# Patient Record
Sex: Female | Born: 1950
Health system: Southern US, Community
[De-identification: ages and names within clinical notes are randomized; demographics above are authoritative.]

## PROBLEM LIST (undated history)

## (undated) DIAGNOSIS — I2510 Atherosclerotic heart disease of native coronary artery without angina pectoris: Secondary | ICD-10-CM

## (undated) DIAGNOSIS — Z87442 Personal history of urinary calculi: Secondary | ICD-10-CM

## (undated) DIAGNOSIS — N289 Disorder of kidney and ureter, unspecified: Secondary | ICD-10-CM

## (undated) DIAGNOSIS — N200 Calculus of kidney: Secondary | ICD-10-CM

## (undated) DIAGNOSIS — J449 Chronic obstructive pulmonary disease, unspecified: Secondary | ICD-10-CM

## (undated) DIAGNOSIS — I10 Essential (primary) hypertension: Secondary | ICD-10-CM

## (undated) HISTORY — PX: BREAST CYST ASPIRATION: SHX578

## (undated) HISTORY — PX: BREAST BIOPSY: SHX20

## (undated) HISTORY — DX: Calculus of kidney: N20.0

## (undated) HISTORY — PX: CARDIAC SURGERY: SHX584

---

## 2003-08-31 ENCOUNTER — Other Ambulatory Visit: Payer: Self-pay

## 2003-09-04 ENCOUNTER — Other Ambulatory Visit: Payer: Self-pay

## 2003-11-25 ENCOUNTER — Ambulatory Visit (HOSPITAL_COMMUNITY): Admission: RE | Admit: 2003-11-25 | Discharge: 2003-11-26 | Payer: Self-pay | Admitting: *Deleted

## 2005-04-11 ENCOUNTER — Emergency Department: Payer: Self-pay | Admitting: Unknown Physician Specialty

## 2005-04-11 ENCOUNTER — Other Ambulatory Visit: Payer: Self-pay

## 2007-10-14 DIAGNOSIS — I10 Essential (primary) hypertension: Secondary | ICD-10-CM | POA: Insufficient documentation

## 2007-10-14 DIAGNOSIS — E785 Hyperlipidemia, unspecified: Secondary | ICD-10-CM | POA: Insufficient documentation

## 2008-02-04 ENCOUNTER — Ambulatory Visit: Payer: Self-pay | Admitting: Internal Medicine

## 2008-02-25 ENCOUNTER — Ambulatory Visit: Payer: Self-pay | Admitting: Internal Medicine

## 2009-03-04 ENCOUNTER — Ambulatory Visit: Payer: Self-pay | Admitting: Internal Medicine

## 2012-11-19 ENCOUNTER — Ambulatory Visit: Payer: Self-pay | Admitting: Internal Medicine

## 2013-09-13 ENCOUNTER — Ambulatory Visit: Payer: Self-pay | Admitting: Internal Medicine

## 2014-09-04 ENCOUNTER — Ambulatory Visit
Admit: 2014-09-04 | Disposition: A | Discharge status: Home / Self Care | Payer: Self-pay | Attending: Internal Medicine | Admitting: Internal Medicine

## 2015-06-23 ENCOUNTER — Other Ambulatory Visit: Payer: Self-pay | Admitting: Internal Medicine

## 2015-06-23 DIAGNOSIS — Z1231 Encounter for screening mammogram for malignant neoplasm of breast: Secondary | ICD-10-CM

## 2015-09-06 ENCOUNTER — Ambulatory Visit
Admission: RE | Admit: 2015-09-06 | Discharge: 2015-09-06 | Disposition: A | Discharge status: Home / Self Care | Payer: BLUE CROSS/BLUE SHIELD | Source: Ambulatory Visit | Attending: Internal Medicine | Admitting: Internal Medicine

## 2015-09-06 DIAGNOSIS — Z1231 Encounter for screening mammogram for malignant neoplasm of breast: Secondary | ICD-10-CM | POA: Diagnosis present

## 2015-09-08 ENCOUNTER — Other Ambulatory Visit: Payer: Self-pay | Admitting: Internal Medicine

## 2015-09-08 DIAGNOSIS — R928 Other abnormal and inconclusive findings on diagnostic imaging of breast: Secondary | ICD-10-CM

## 2015-09-15 ENCOUNTER — Other Ambulatory Visit: Payer: BLUE CROSS/BLUE SHIELD

## 2015-09-15 ENCOUNTER — Ambulatory Visit: Payer: BLUE CROSS/BLUE SHIELD

## 2015-10-01 ENCOUNTER — Ambulatory Visit
Admission: RE | Admit: 2015-10-01 | Discharge: 2015-10-01 | Disposition: A | Discharge status: Home / Self Care | Payer: BLUE CROSS/BLUE SHIELD | Source: Ambulatory Visit | Attending: Internal Medicine | Admitting: Internal Medicine

## 2015-10-01 DIAGNOSIS — N6489 Other specified disorders of breast: Secondary | ICD-10-CM | POA: Diagnosis present

## 2015-10-01 DIAGNOSIS — R928 Other abnormal and inconclusive findings on diagnostic imaging of breast: Secondary | ICD-10-CM

## 2015-10-01 DIAGNOSIS — N63 Unspecified lump in breast: Secondary | ICD-10-CM | POA: Diagnosis not present

## 2016-01-02 ENCOUNTER — Inpatient Hospital Stay
Admission: EM | Admit: 2016-01-02 | Discharge: 2016-01-04 | DRG: 247 | Disposition: A | Discharge status: Home / Self Care | Payer: BC Managed Care – PPO | Attending: Internal Medicine | Admitting: Internal Medicine

## 2016-01-02 ENCOUNTER — Emergency Department: Payer: BC Managed Care – PPO

## 2016-01-02 ENCOUNTER — Encounter: Payer: Self-pay | Admitting: Emergency Medicine

## 2016-01-02 DIAGNOSIS — I119 Hypertensive heart disease without heart failure: Secondary | ICD-10-CM | POA: Diagnosis present

## 2016-01-02 DIAGNOSIS — I2510 Atherosclerotic heart disease of native coronary artery without angina pectoris: Secondary | ICD-10-CM | POA: Diagnosis present

## 2016-01-02 DIAGNOSIS — Z955 Presence of coronary angioplasty implant and graft: Secondary | ICD-10-CM

## 2016-01-02 DIAGNOSIS — E871 Hypo-osmolality and hyponatremia: Secondary | ICD-10-CM | POA: Diagnosis present

## 2016-01-02 DIAGNOSIS — F17210 Nicotine dependence, cigarettes, uncomplicated: Secondary | ICD-10-CM | POA: Diagnosis present

## 2016-01-02 DIAGNOSIS — Z91041 Radiographic dye allergy status: Secondary | ICD-10-CM

## 2016-01-02 DIAGNOSIS — R778 Other specified abnormalities of plasma proteins: Secondary | ICD-10-CM

## 2016-01-02 DIAGNOSIS — Z880 Allergy status to penicillin: Secondary | ICD-10-CM | POA: Diagnosis not present

## 2016-01-02 DIAGNOSIS — J449 Chronic obstructive pulmonary disease, unspecified: Secondary | ICD-10-CM | POA: Diagnosis present

## 2016-01-02 DIAGNOSIS — I214 Non-ST elevation (NSTEMI) myocardial infarction: Secondary | ICD-10-CM | POA: Diagnosis not present

## 2016-01-02 DIAGNOSIS — R7989 Other specified abnormal findings of blood chemistry: Secondary | ICD-10-CM

## 2016-01-02 DIAGNOSIS — R079 Chest pain, unspecified: Secondary | ICD-10-CM | POA: Diagnosis present

## 2016-01-02 HISTORY — DX: Essential (primary) hypertension: I10

## 2016-01-02 HISTORY — DX: Disorder of kidney and ureter, unspecified: N28.9

## 2016-01-02 HISTORY — DX: Atherosclerotic heart disease of native coronary artery without angina pectoris: I25.10

## 2016-01-02 LAB — CBC
HCT: 41.6 % (ref 35.0–47.0)
HEMOGLOBIN: 14.7 g/dL (ref 12.0–16.0)
MCH: 34.5 pg — AB (ref 26.0–34.0)
MCHC: 35.3 g/dL (ref 32.0–36.0)
MCV: 97.7 fL (ref 80.0–100.0)
Platelets: 271 10*3/uL (ref 150–440)
RBC: 4.25 MIL/uL (ref 3.80–5.20)
RDW: 13.9 % (ref 11.5–14.5)
WBC: 6.5 10*3/uL (ref 3.6–11.0)

## 2016-01-02 LAB — TROPONIN I
TROPONIN I: 0.43 ng/mL — AB (ref <0.03)
TROPONIN I: 1.68 ng/mL — AB (ref <0.03)
Troponin I: 3.12 ng/mL (ref <0.03)

## 2016-01-02 LAB — BASIC METABOLIC PANEL
ANION GAP: 11 (ref 5–15)
BUN: 6 mg/dL (ref 6–20)
CALCIUM: 9.9 mg/dL (ref 8.9–10.3)
CO2: 22 mmol/L (ref 22–32)
CREATININE: 0.72 mg/dL (ref 0.44–1.00)
Chloride: 98 mmol/L — ABNORMAL LOW (ref 101–111)
GFR calc Af Amer: >60 mL/min (ref >60)
GFR calc non Af Amer: >60 mL/min (ref >60)
GLUCOSE: 137 mg/dL — AB (ref 65–99)
Potassium: 3.6 mmol/L (ref 3.5–5.1)
Sodium: 131 mmol/L — ABNORMAL LOW (ref 135–145)

## 2016-01-02 LAB — PROTIME-INR
INR: 1.11
PROTHROMBIN TIME: 14.3 s (ref 11.4–15.2)

## 2016-01-02 LAB — APTT: aPTT: >160 seconds — ABNORMAL HIGH (ref 24–36)

## 2016-01-02 MED ORDER — ASPIRIN EC 81 MG PO TBEC
81.0000 mg | DELAYED_RELEASE_TABLET | Freq: Every day | ORAL | Status: DC
  Filled 2016-01-02: qty 1

## 2016-01-02 MED ORDER — ACETAMINOPHEN 325 MG PO TABS: 650.0000 mg | ORAL_TABLET | Freq: Four times a day (QID) | ORAL | Status: DC | PRN

## 2016-01-02 MED ORDER — HEPARIN BOLUS VIA INFUSION
2800.0000 [IU] | Freq: Once | INTRAVENOUS | Status: AC
  Administered 2016-01-02: 2800 [IU] via INTRAVENOUS
  Filled 2016-01-02: qty 2800

## 2016-01-02 MED ORDER — ACETAMINOPHEN 650 MG RE SUPP
650.0000 mg | Freq: Four times a day (QID) | RECTAL | Status: DC | PRN
Start: 2016-01-02 — End: 2016-01-04

## 2016-01-02 MED ORDER — ONDANSETRON HCL 4 MG/2ML IJ SOLN: 4.0000 mg | Freq: Four times a day (QID) | INTRAMUSCULAR | Status: DC | PRN

## 2016-01-02 MED ORDER — BISACODYL 5 MG PO TBEC
5.0000 mg | DELAYED_RELEASE_TABLET | Freq: Every day | ORAL | Status: DC | PRN
  Filled 2016-01-02: qty 1

## 2016-01-02 MED ORDER — SODIUM CHLORIDE 0.9 % IV SOLN
INTRAVENOUS | Status: DC
  Administered 2016-01-02 – 2016-01-04 (×3): via INTRAVENOUS

## 2016-01-02 MED ORDER — DOCUSATE SODIUM 100 MG PO CAPS
100.0000 mg | ORAL_CAPSULE | Freq: Two times a day (BID) | ORAL | Status: DC
  Administered 2016-01-02 – 2016-01-04 (×3): 100 mg via ORAL
  Filled 2016-01-02 (×4): qty 1

## 2016-01-02 MED ORDER — ASPIRIN EC 325 MG PO TBEC
325.0000 mg | DELAYED_RELEASE_TABLET | Freq: Every day | ORAL | Status: DC
  Administered 2016-01-03: 325 mg via ORAL
  Filled 2016-01-02: qty 1

## 2016-01-02 MED ORDER — HEPARIN (PORCINE) IN NACL 100-0.45 UNIT/ML-% IJ SOLN
400.0000 [IU]/h | INTRAMUSCULAR | Status: DC
  Administered 2016-01-02: 550 [IU]/h via INTRAVENOUS
  Filled 2016-01-02 (×2): qty 250

## 2016-01-02 MED ORDER — ROSUVASTATIN CALCIUM 10 MG PO TABS
10.0000 mg | ORAL_TABLET | Freq: Every day | ORAL | Status: DC
  Administered 2016-01-03 – 2016-01-04 (×2): 10 mg via ORAL
  Filled 2016-01-02 (×2): qty 1

## 2016-01-02 MED ORDER — NITROGLYCERIN 2 % TD OINT
0.5000 [in_us] | TOPICAL_OINTMENT | Freq: Once | TRANSDERMAL | Status: AC
  Administered 2016-01-02: 0.5 [in_us] via TOPICAL
  Filled 2016-01-02: qty 1

## 2016-01-02 MED ORDER — ONDANSETRON HCL 4 MG PO TABS: 4.0000 mg | ORAL_TABLET | Freq: Four times a day (QID) | ORAL | Status: DC | PRN

## 2016-01-02 MED ORDER — ASPIRIN 81 MG PO CHEW
243.0000 mg | CHEWABLE_TABLET | Freq: Once | ORAL | Status: AC
  Administered 2016-01-02: 243 mg via ORAL
  Filled 2016-01-02: qty 3

## 2016-01-02 MED ORDER — SODIUM CHLORIDE 0.9% FLUSH
3.0000 mL | Freq: Two times a day (BID) | INTRAVENOUS | Status: DC
  Administered 2016-01-02 – 2016-01-04 (×3): 3 mL via INTRAVENOUS

## 2016-01-02 MED ORDER — HYDROCODONE-ACETAMINOPHEN 5-325 MG PO TABS: 1.0000 | ORAL_TABLET | ORAL | Status: DC | PRN

## 2016-01-02 MED ORDER — NITROGLYCERIN 2 % TD OINT
0.5000 [in_us] | TOPICAL_OINTMENT | Freq: Four times a day (QID) | TRANSDERMAL | Status: DC
  Administered 2016-01-02: 1 [in_us] via TOPICAL
  Administered 2016-01-03 (×2): 0.5 [in_us] via TOPICAL
  Administered 2016-01-03: 1 [in_us] via TOPICAL
  Filled 2016-01-02 (×5): qty 1

## 2016-01-02 NOTE — ED Notes (Signed)
Spoke with hospitalist regarding elevated troponin, hospitalist to order heparin. Confirmed with pt she will accept heparin.

## 2016-01-02 NOTE — Progress Notes (Addendum)
ANTICOAGULATION CONSULT NOTE - Initial Consult  Pharmacy Consult for Heparin Drip  Indication: chest pain/ACS  Allergies  Allergen Reactions  . Penicillins Anaphylaxis    Has patient had a PCN reaction causing immediate rash, facial/tongue/throat swelling, SOB or lightheadedness with hypotension: Yes Has patient had a PCN reaction causing severe rash involving mucus membranes or skin necrosis: No Has patient had a PCN reaction that required hospitalization No Has patient had a PCN reaction occurring within the last 10 years: Yes If all of the above answers are "NO", then may proceed with Cephalosporin use.  Julia Dominguez [Iodinated Diagnostic Agents] Swelling    Patient Measurements: Height: '5\' 1"'$  (154.9 cm) Weight: 103 lb (46.7 kg) IBW/kg (Calculated) : 47.8  Vital Signs: Temp: 97.9 F (36.6 C) (08/06 1541) Temp Source: Oral (08/06 1541) BP: 169/83 (08/06 1900) Pulse Rate: 59 (08/06 1900)   Recent Labs  01/02/16 1547 01/02/16 1834  HGB 14.7  --   HCT 41.6  --   PLT 271  --   CREATININE 0.72  --   TROPONINI 0.43* 1.68*    Estimated Creatinine Clearance: 53.1 mL/min (by C-G formula based on SCr of 0.8 mg/dL).   Medical History: Past Medical History:  Diagnosis Date  . Coronary artery disease   . Hypertension   . Renal disorder    renal stent    Assessment: 65 yo female with chest pain and elevated troponin of 1.68. Pharmacy consulted for heparin dosing for NSTEMI.   Goal of Therapy:  Heparin level 0.3-0.7 units/ml Monitor platelets by anticoagulation protocol: Yes   Plan:  Baseline PT/INR and aPTT ordered.  Patient is 47kg Give 2800 units bolus x 1 Start heparin infusion at 550 units/hr Check anti-Xa level in 6 hours and daily while on heparin Continue to monitor H&H and platelets  Julia Dominguez, PharmD Clinical Pharmacist 01/02/2016 8:19 PM  [id number] baseline aPTT supratherapeutic, but was drawn 2 hours after bolus/infusion began. Will hold x 1  hour then resume at 400 units/hr. Recheck HL at 0700. Julia Dominguez A. [place], [place].D., BCPS

## 2016-01-02 NOTE — ED Triage Notes (Signed)
Patient c/o chest pain.  States pain initially started this morning with a mid back pain that radiated down left arm and then worsened to include mid chest pain.  Patient describes pain as heaviness and pressure "like a cinder block sitting on my chest".  States pain is similar to pain she had prior to having stents placed in 2007.  Pain is currently in back and a "gripping" pain around heart.

## 2016-01-02 NOTE — ED Notes (Signed)
Pt with nitro paste already applied by previous nurse.

## 2016-01-02 NOTE — H&P (Signed)
[facility] [place] NAME: Julia Dominguez    MR#:  [id number]  DATE OF BIRTH:  06-14-1950  DATE OF ADMISSION:  01/02/2016  PRIMARY CARE PHYSICIAN: No primary care provider on file.  Says that she sees Dr.Neelam Humphrey Rolls  REQUESTING/REFERRING PHYSICIAN: Dr. Conni Slipper  CHIEF COMPLAINT:   Chief Complaint  Patient presents with  . Chest Pain    HISTORY OF PRESENT ILLNESS:  Julia Dominguez  is a 65 y.o. female with a known history of Hypertension, coronary artery disease with history of stent placement. Your several comes in with chest pain. Since this morning Started having chest pain in the middle of the chest radiation to the back. Patient also complained of chest pain going to the left arm. Patient felt like chest pain was heaviness in the chest associated with nausea, sweating. He reports that she woke up. This morning. With chest pain. And she feels similar to what she had prior to getting her stent in 2007. Chest pain relieved with nitroglycerin given in the emergency room. PAST MEDICAL HISTORY:   Past Medical History:  Diagnosis Date  . Coronary artery disease   . Hypertension   . Renal disorder    renal stent    PAST SURGICAL HISTOIRY:   Past Surgical History:  Procedure Laterality Date  . BREAST BIOPSY Right    neg  . CARDIAC SURGERY     cardiac stents placed 2007    SOCIAL HISTORY:   Social History  Substance Use Topics  . Smoking status: Current Every Day Smoker    Types: Cigarettes  . Smokeless tobacco: Never Used  . Alcohol use Not on file    FAMILY HISTORY:   Family History  Problem Relation Age of Onset  . Breast cancer Paternal Grandmother     DRUG ALLERGIES:   Allergies  Allergen Reactions  . Ivp Dye [Iodinated Diagnostic Agents] Swelling  . Penicillins     REVIEW OF SYSTEMS:  CONSTITUTIONAL: No fever, fatigue or weakness.  EYES: No blurred or double vision.  EARS, NOSE, AND THROAT:  No tinnitus or ear pain.  RESPIRATORY: No cough, shortness of breath, wheezing or hemoptysis.  CARDIOVASCULAR: No chest pain, orthopnea, edema.  GASTROINTESTINAL: No nausea, vomiting, diarrhea or abdominal pain.  GENITOURINARY: No dysuria, hematuria.  ENDOCRINE: No polyuria, nocturia,  HEMATOLOGY: No anemia, easy bruising or bleeding SKIN: No rash or lesion. MUSCULOSKELETAL: No joint pain or arthritis.   NEUROLOGIC: No tingling, numbness, weakness.  PSYCHIATRY: No anxiety or depression.   MEDICATIONS AT HOME:   Prior to Admission medications   Not on File      VITAL SIGNS:  Blood pressure 137/82, pulse (!) 56, temperature 97.9 F (36.6 C), temperature source Oral, resp. rate 17, height '5\' 1"'$  (1.549 m), weight 46.7 kg (103 lb), SpO2 99 %.  PHYSICAL EXAMINATION:  GENERAL:  65 y.o.-year-old patient lying in the bed with no acute distress.  EYES: Pupils equal, round, reactive to light and accommodation. No scleral icterus. Extraocular muscles intact.  HEENT: Head atraumatic, normocephalic. Oropharynx and nasopharynx clear.  NECK:  Supple, no jugular venous distention. No thyroid enlargement, no tenderness.  LUNGS: Normal breath sounds bilaterally, no wheezing, rales,rhonchi or crepitation. No use of accessory muscles of respiration.  CARDIOVASCULAR: S1, S2 normal. No murmurs, rubs, or gallops.  ABDOMEN: Soft, nontender, nondistended. Bowel sounds present. No organomegaly or mass.  EXTREMITIES: No pedal edema, cyanosis, or clubbing.  NEUROLOGIC: Cranial nerves II through XII are  intact. Muscle strength 5/5 in all extremities. Sensation intact. Gait not checked.  PSYCHIATRIC: The patient is alert and oriented x 3.  SKIN: No obvious rash, lesion, or ulcer.   LABORATORY PANEL:   CBC  Recent Labs Lab 01/02/16 1547  WBC 6.5  HGB 14.7  HCT 41.6  PLT 271    ------------------------------------------------------------------------------------------------------------------  Chemistries   Recent Labs Lab 01/02/16 1547  NA 131*  K 3.6  CL 98*  CO2 22  GLUCOSE 137*  BUN 6  CREATININE 0.72  CALCIUM 9.9   ------------------------------------------------------------------------------------------------------------------  Cardiac Enzymes  Recent Labs Lab 01/02/16 1547  TROPONINI 0.43*   ------------------------------------------------------------------------------------------------------------------  RADIOLOGY:  Dg Chest 2 View  Result Date: 01/02/2016 CLINICAL DATA:  65 year old female with mid chest pain since this morning. EXAM: CHEST  2 VIEW COMPARISON:  Chest x-ray 04/11/2005. FINDINGS: Lung volumes are normal. No consolidative airspace disease. No pleural effusions. No pneumothorax. No pulmonary nodule or mass noted. Pulmonary vasculature and the cardiomediastinal silhouette are within normal limits. Atherosclerosis in the thoracic aorta. IMPRESSION: 1.  No radiographic evidence of acute cardiopulmonary disease. 2. Aortic atherosclerosis. Electronically Signed   By: Vinnie Langton M.D.   On: 01/02/2016 16:48    EKG:   Orders placed or performed during the hospital encounter of 01/02/16  . ED EKG within 10 minutes  . ED EKG within 10 minutes  . EKG 12-Lead  . EKG 12-Lead   Normal sinus rhythm 62 bpm ST depressions in lead V5, V6., ST depressions in V aVL, IMPRESSION AND PLAN:   #1 chest pain with non-ST elevation MI in a patient with history of coronary disease, stent placement, elevated troponins up to 0.43. Concerning for unstable angina. Started on aspirin, nitrates. Patient has bradycardia with heart rate in 50s so unable to use beta blockers. Patient refused  Full  dose anticoagulation,says that her blood is  Too  thin and doesn't want any heparin or Lovenox. Consults cardiology, patient has seen Dr. Clayborn Bigness  before. Started on high intensity statins also. #2 history of COPD with continued tobacco abuse patient says that she smokes one pack of cigarettes every 3 days. Advised to quit smoking, offered assistance, smoking cessation counseling done for 5 minutes.  D/w son and pt All the records are reviewed and case discussed with ED provider. Management plans discussed with the patient, family and they are in agreement.  CODE STATUS:  Full code  TOTAL TIME TAKING CARE OF THIS PATIENT: 55  minutes.    Epifanio Lesches M.D on 01/02/2016 at 5:28 PM  Between 7am to 6pm - Pager - [phone]  After 6pm go to [email or web address] - password [email or web address] [facility]  [facility]  Office  [phone]  CC: Primary care physician; No primary care provider on file.  Note: This dictation was prepared with Dragon dictation along with smaller phrase technology. Any transcriptional errors that result from this process are unintentional.

## 2016-01-02 NOTE — ED Provider Notes (Addendum)
[facility] Provider Note   CSN: [id number] Arrival date & time: 01/02/16  1530  First Provider Contact:  First MD Initiated Contact with Patient 01/02/16 1627        History   Chief Complaint Chief Complaint  Patient presents with  . Chest Pain    HPI Julia Dominguez is a 65 y.o. female who has a past history of high blood pressure high cholesterol has one stent placed in 2007. Patient reports she woke up this morning with pain in her back and pain going down her left arm. This persisted intermittently all to the course of day and then this evening she began having feeling like cinderblocks were sitting on her chest and another was chest heaviness. This feels similar to what she had prior to getting her stent in 2007. Patient really has not been that active to see if activity make it worse. No other associated symptoms, and it  HPI  Past Medical History:  Diagnosis Date  . Coronary artery disease   . Hypertension   . Renal disorder    renal stent    There are no active problems to display for this patient.   Past Surgical History:  Procedure Laterality Date  . BREAST BIOPSY Right    neg  . CARDIAC SURGERY     cardiac stents placed 2007    OB History    No data available       Home Medications    Prior to Admission medications   Not on File    Family History Family History  Problem Relation Age of Onset  . Breast cancer Paternal Grandmother     Social History Social History  Substance Use Topics  . Smoking status: Current Every Day Smoker    Types: Cigarettes  . Smokeless tobacco: Never Used  . Alcohol use Not on file     Allergies   Ivp dye [iodinated diagnostic agents] and Penicillins   Review of Systems Review of Systems   Physical Exam Updated Vital Signs BP (!) 162/77 (BP Location: Right Arm)   Pulse 61   Temp 97.9 F (36.6 C) (Oral)   Resp 18   Ht '5\' 1"'$  (1.549 m)   Wt 103 lb (46.7 kg)   SpO2 97%   BMI  19.46 kg/m   Physical Exam   ED Treatments / Results  Labs (all labs ordered are listed, but only abnormal results are displayed) Labs Reviewed  BASIC METABOLIC PANEL - Abnormal; Notable for the following:       Result Value   Sodium 131 (*)    Chloride 98 (*)    Glucose, Bld 137 (*)    All other components within normal limits  CBC - Abnormal; Notable for the following:    MCH 34.5 (*)    All other components within normal limits  TROPONIN I - Abnormal; Notable for the following:    Troponin I 0.43 (*)    All other components within normal limits    EKG  EKG Interpretation None     EKG read and interpreted by me shows sinus rhythm actually sinus bradycardia rate of 57 normal axis there is some ST segment downsloping in the lateral chest leads  Radiology Dg Chest 2 View  Result Date: 01/02/2016 CLINICAL DATA:  65 year old female with mid chest pain since this morning. EXAM: CHEST  2 VIEW COMPARISON:  Chest x-ray 04/11/2005. FINDINGS: Lung volumes are normal. No consolidative airspace disease. No pleural effusions. No pneumothorax.  No pulmonary nodule or mass noted. Pulmonary vasculature and the cardiomediastinal silhouette are within normal limits. Atherosclerosis in the thoracic aorta. IMPRESSION: 1.  No radiographic evidence of acute cardiopulmonary disease. 2. Aortic atherosclerosis. Electronically Signed   By: Vinnie Langton M.D.   On: 01/02/2016 16:48     Chest x-ray has been done and completed but is not available in the computer at all Procedures Procedures (including critical care time)  Medications Ordered in ED Medications  aspirin chewable tablet 243 mg (not administered)  nitroGLYCERIN (NITROGLYN) 2 % ointment 0.5 inch (not administered)     Initial Impression / Assessment and Plan / ED Course  I have reviewed the triage vital signs and the nursing notes.  Pertinent labs & imaging results that were available during my care of the patient were reviewed  by me and considered in my medical decision making (see chart for details).  Clinical Course      Final Clinical Impressions(s) / ED Diagnoses   Final diagnoses:  Elevated troponin  NSTEMI (non-ST elevated myocardial infarction) [facility])    New Prescriptions New Prescriptions   No medications on file     Nena Polio, MD 01/02/16 1657    Nena Polio, MD 01/13/16 [phone]

## 2016-01-02 NOTE — ED Notes (Signed)
Admitting MD at bedside at this time.

## 2016-01-02 NOTE — ED Notes (Signed)
Attempt to call report x3 since 0704 without success.

## 2016-01-03 ENCOUNTER — Encounter: Payer: Self-pay | Admitting: Internal Medicine

## 2016-01-03 ENCOUNTER — Encounter
Admission: EM | Disposition: A | Discharge status: Home / Self Care | Payer: Self-pay | Source: Home / Self Care | Attending: Internal Medicine

## 2016-01-03 DIAGNOSIS — I214 Non-ST elevation (NSTEMI) myocardial infarction: Secondary | ICD-10-CM

## 2016-01-03 HISTORY — PX: CARDIAC CATHETERIZATION: SHX172

## 2016-01-03 LAB — BASIC METABOLIC PANEL
ANION GAP: 6 (ref 5–15)
BUN: 6 mg/dL (ref 6–20)
CALCIUM: 8.8 mg/dL — AB (ref 8.9–10.3)
CHLORIDE: 105 mmol/L (ref 101–111)
CO2: 23 mmol/L (ref 22–32)
Creatinine, Ser: 0.49 mg/dL (ref 0.44–1.00)
GFR calc non Af Amer: >60 mL/min (ref >60)
Glucose, Bld: 107 mg/dL — ABNORMAL HIGH (ref 65–99)
POTASSIUM: 3.5 mmol/L (ref 3.5–5.1)
Sodium: 134 mmol/L — ABNORMAL LOW (ref 135–145)

## 2016-01-03 LAB — TROPONIN I: TROPONIN I: 5.05 ng/mL — AB (ref <0.03)

## 2016-01-03 LAB — CBC
HEMATOCRIT: 37 % (ref 35.0–47.0)
HEMOGLOBIN: 13 g/dL (ref 12.0–16.0)
MCH: 34.2 pg — AB (ref 26.0–34.0)
MCHC: 35.1 g/dL (ref 32.0–36.0)
MCV: 97.2 fL (ref 80.0–100.0)
Platelets: 243 10*3/uL (ref 150–440)
RBC: 3.8 MIL/uL (ref 3.80–5.20)
RDW: 14 % (ref 11.5–14.5)
WBC: 7.5 10*3/uL (ref 3.6–11.0)

## 2016-01-03 LAB — HEPARIN LEVEL (UNFRACTIONATED)
Heparin Unfractionated: 0.13 IU/mL — ABNORMAL LOW (ref 0.30–0.70)
Heparin Unfractionated: <0.1 IU/mL — ABNORMAL LOW (ref 0.30–0.70)

## 2016-01-03 LAB — GLUCOSE, CAPILLARY: Glucose-Capillary: 106 mg/dL — ABNORMAL HIGH (ref 65–99)

## 2016-01-03 SURGERY — LEFT HEART CATH
Anesthesia: Moderate Sedation | Laterality: Right

## 2016-01-03 MED ORDER — ASPIRIN 81 MG PO CHEW: 81.0000 mg | CHEWABLE_TABLET | ORAL | Status: DC

## 2016-01-03 MED ORDER — SODIUM CHLORIDE 0.9 % WEIGHT BASED INFUSION: 1.0000 mL/kg/h | INTRAVENOUS | Status: DC

## 2016-01-03 MED ORDER — TICAGRELOR 90 MG PO TABS
ORAL_TABLET | ORAL | Status: DC | PRN
  Administered 2016-01-03: 180 mg via ORAL

## 2016-01-03 MED ORDER — MIDAZOLAM HCL 2 MG/2ML IJ SOLN
INTRAMUSCULAR | Status: DC | PRN
  Administered 2016-01-03: 1 mg via INTRAVENOUS

## 2016-01-03 MED ORDER — TICAGRELOR 90 MG PO TABS
ORAL_TABLET | ORAL | Status: AC
  Filled 2016-01-03: qty 2

## 2016-01-03 MED ORDER — ASPIRIN 81 MG PO CHEW
81.0000 mg | CHEWABLE_TABLET | Freq: Every day | ORAL | Status: DC
  Administered 2016-01-04: 81 mg via ORAL
  Filled 2016-01-03: qty 1

## 2016-01-03 MED ORDER — NITROGLYCERIN 1 MG/10 ML FOR IR/CATH LAB
INTRA_ARTERIAL | Status: DC | PRN
  Administered 2016-01-03: 200 ug via INTRA_ARTERIAL

## 2016-01-03 MED ORDER — METHYLPREDNISOLONE SODIUM SUCC 125 MG IJ SOLR: 125.0000 mg | INTRAMUSCULAR | Status: DC

## 2016-01-03 MED ORDER — BIVALIRUDIN 250 MG IV SOLR
INTRAVENOUS | Status: AC
  Filled 2016-01-03: qty 250

## 2016-01-03 MED ORDER — FENTANYL CITRATE (PF) 100 MCG/2ML IJ SOLN
INTRAMUSCULAR | Status: DC | PRN
  Administered 2016-01-03 (×2): 25 ug via INTRAVENOUS

## 2016-01-03 MED ORDER — SODIUM CHLORIDE 0.9 % WEIGHT BASED INFUSION: 3.0000 mL/kg/h | INTRAVENOUS | Status: DC

## 2016-01-03 MED ORDER — FAMOTIDINE IN NACL 20-0.9 MG/50ML-% IV SOLN
20.0000 mg | INTRAVENOUS | Status: DC
  Filled 2016-01-03: qty 50

## 2016-01-03 MED ORDER — SODIUM CHLORIDE 0.9 % IV SOLN
0.2500 mg/kg/h | INTRAVENOUS | Status: DC
  Filled 2016-01-03: qty 250

## 2016-01-03 MED ORDER — SODIUM CHLORIDE 0.9 % WEIGHT BASED INFUSION
3.0000 mL/kg/h | INTRAVENOUS | Status: AC
  Administered 2016-01-03 (×2): 3 mL/kg/h via INTRAVENOUS

## 2016-01-03 MED ORDER — DIPHENHYDRAMINE HCL 50 MG/ML IJ SOLN
INTRAMUSCULAR | Status: AC
  Administered 2016-01-03: 14:00:00
  Filled 2016-01-03: qty 1

## 2016-01-03 MED ORDER — FAMOTIDINE 20 MG PO TABS
ORAL_TABLET | ORAL | Status: AC
  Administered 2016-01-03: 14:00:00
  Filled 2016-01-03: qty 1

## 2016-01-03 MED ORDER — SODIUM CHLORIDE 0.9% FLUSH: 3.0000 mL | INTRAVENOUS | Status: DC | PRN

## 2016-01-03 MED ORDER — METHYLPREDNISOLONE SODIUM SUCC 125 MG IJ SOLR
INTRAMUSCULAR | Status: AC
  Administered 2016-01-03: 14:00:00
  Filled 2016-01-03: qty 2

## 2016-01-03 MED ORDER — NICOTINE 21 MG/24HR TD PT24: 21.0000 mg | MEDICATED_PATCH | Freq: Every day | TRANSDERMAL | Status: DC

## 2016-01-03 MED ORDER — DIPHENHYDRAMINE HCL 50 MG/ML IJ SOLN: 25.0000 mg | INTRAMUSCULAR | Status: DC

## 2016-01-03 MED ORDER — NITROGLYCERIN 5 MG/ML IV SOLN
INTRAVENOUS | Status: AC
  Filled 2016-01-03: qty 10

## 2016-01-03 MED ORDER — SODIUM CHLORIDE 0.9 % IV SOLN: 250.0000 mL | INTRAVENOUS | Status: DC | PRN

## 2016-01-03 MED ORDER — TICAGRELOR 90 MG PO TABS
90.0000 mg | ORAL_TABLET | Freq: Two times a day (BID) | ORAL | Status: DC
  Administered 2016-01-03 – 2016-01-04 (×2): 90 mg via ORAL
  Filled 2016-01-03 (×2): qty 1

## 2016-01-03 MED ORDER — BIVALIRUDIN BOLUS VIA INFUSION - CUPID
INTRAVENOUS | Status: DC | PRN
  Administered 2016-01-03: 34.575 mg via INTRAVENOUS

## 2016-01-03 MED ORDER — FAMOTIDINE IN NACL 20-0.9 MG/50ML-% IV SOLN
INTRAVENOUS | Status: DC | PRN
  Administered 2016-01-03: 20 mg via INTRAVENOUS

## 2016-01-03 MED ORDER — HEPARIN (PORCINE) IN NACL 100-0.45 UNIT/ML-% IJ SOLN
550.0000 [IU]/h | INTRAMUSCULAR | Status: DC
  Administered 2016-01-03: 550 [IU]/h via INTRAVENOUS
  Filled 2016-01-03: qty 250

## 2016-01-03 MED ORDER — SODIUM CHLORIDE 0.9% FLUSH: 3.0000 mL | Freq: Two times a day (BID) | INTRAVENOUS | Status: DC

## 2016-01-03 MED ORDER — FENTANYL CITRATE (PF) 100 MCG/2ML IJ SOLN
INTRAMUSCULAR | Status: DC | PRN
  Administered 2016-01-03: 25 ug via INTRAVENOUS

## 2016-01-03 MED ORDER — MIDAZOLAM HCL 2 MG/2ML IJ SOLN
INTRAMUSCULAR | Status: AC
  Filled 2016-01-03: qty 2

## 2016-01-03 MED ORDER — FENTANYL CITRATE (PF) 100 MCG/2ML IJ SOLN
INTRAMUSCULAR | Status: AC
  Filled 2016-01-03: qty 2

## 2016-01-03 MED ORDER — SODIUM CHLORIDE 0.9 % IV SOLN
INTRAVENOUS | Status: DC | PRN
  Administered 2016-01-03: 1.75 mg/kg/h via INTRAVENOUS

## 2016-01-03 MED ORDER — HEPARIN (PORCINE) IN NACL 2-0.9 UNIT/ML-% IJ SOLN
INTRAMUSCULAR | Status: AC
  Filled 2016-01-03: qty 500

## 2016-01-03 MED ORDER — IOPAMIDOL (ISOVUE-300) INJECTION 61%
INTRAVENOUS | Status: DC | PRN
  Administered 2016-01-03: 250 mL via INTRA_ARTERIAL

## 2016-01-03 MED ORDER — FAMOTIDINE 20 MG PO TABS
20.0000 mg | ORAL_TABLET | Freq: Once | ORAL | Status: AC
  Administered 2016-01-03: 20 mg via ORAL

## 2016-01-03 MED ORDER — ACETAMINOPHEN 325 MG PO TABS: 650.0000 mg | ORAL_TABLET | ORAL | Status: DC | PRN

## 2016-01-03 MED ORDER — METHYLPREDNISOLONE SODIUM SUCC 125 MG IJ SOLR
INTRAMUSCULAR | Status: DC | PRN
  Administered 2016-01-03: 125 mg via INTRAVENOUS

## 2016-01-03 MED ORDER — DIPHENHYDRAMINE HCL 50 MG/ML IJ SOLN
INTRAMUSCULAR | Status: DC | PRN
  Administered 2016-01-03: 50 mg via INTRAVENOUS

## 2016-01-03 MED ORDER — HEPARIN BOLUS VIA INFUSION
1400.0000 [IU] | Freq: Once | INTRAVENOUS | Status: AC
  Administered 2016-01-03: 1400 [IU] via INTRAVENOUS
  Filled 2016-01-03: qty 1400

## 2016-01-03 MED ORDER — ONDANSETRON HCL 4 MG/2ML IJ SOLN: 4.0000 mg | Freq: Four times a day (QID) | INTRAMUSCULAR | Status: DC | PRN

## 2016-01-03 SURGICAL SUPPLY — 16 items
BALLN TREK RX 2.5X12 (BALLOONS) ×3
BALLOON TREK RX 2.5X12 (BALLOONS) IMPLANT
CATH INFINITI 5FR ANG PIGTAIL (CATHETERS) ×1 IMPLANT
CATH INFINITI 5FR JL4 (CATHETERS) ×1 IMPLANT
CATH INFINITI JR4 5F (CATHETERS) ×1 IMPLANT
CATH VISTA GUIDE 6FR JR4 SH (CATHETERS) ×1 IMPLANT
DEVICE CLOSURE MYNXGRIP 6/7F (Vascular Products) ×1 IMPLANT
KIT MANI 3VAL PERCEP (MISCELLANEOUS) ×3 IMPLANT
NDL PERC 18GX7CM (NEEDLE) IMPLANT
NEEDLE PERC 18GX7CM (NEEDLE) ×3 IMPLANT
PACK CARDIAC CATH (CUSTOM PROCEDURE TRAY) ×3 IMPLANT
SHEATH AVANTI 6FR X 11CM (SHEATH) ×1 IMPLANT
SHEATH PINNACLE 5F 10CM (SHEATH) ×1 IMPLANT
STENT XIENCE ALPINE RX 2.75X18 (Permanent Stent) ×1 IMPLANT
WIRE EMERALD 3MM-J .035X150CM (WIRE) ×1 IMPLANT
WIRE G HI TQ BMW 190 (WIRE) ×1 IMPLANT

## 2016-01-03 NOTE — Plan of Care (Signed)
Problem: Phase I Progression Outcomes Goal: Anginal pain relieved Outcome: Progressing Denied any chest pain at this moment.

## 2016-01-03 NOTE — Progress Notes (Signed)
Patient is alert and oriented x 4, admitted to room 247 with the diagnosis of NSTEMI. Denied any acute pain, no respiratory distress noted. Patient is oriented to her room, call bell/ascom and staff. Skin assessment done with Gelene Mink. RN, no skin issues noted but bruises on bil. arms. Tele box called to CCMD with Gelene Mink. RN as a second verifier. Will continue to monitor.

## 2016-01-03 NOTE — Consult Note (Signed)
[facility] Consultation Note  Patient ID: Julia Dominguez [facility], MRN: [id number], DOB/AGE: 09-20-1950 65 y.o. Admit date: 01/02/2016   Date of Consult: 01/03/2016 Primary Physician: No primary care provider on file. Primary Cardiologist: [place]  Chief Complaint:  Chief Complaint  Patient presents with  . Chest Pain   Reason for Consult: acute non-ST elevation myocardial infarction  HPI: 65 y.o. female with known coronary artery disease status post previous stenting in the remote past for which the patient has been on appropriate medication management including Crestor for hyperlipidemia and aspirin. The patient has not had any significant hypertension requiring additional medication management. The patient has had new onset of crushing terminal chest discomfort radiating into her back and left arm causing her to be short of breath and weak and fatigue over 24 hour period and was seen in the emergency room with EKG showing normal sinus rhythm and diffuse ST changes. The patient also has had significant elevation of troponin consistent with non-ST elevation myocardial infarction. The patient does have appropriate medication management including heparin and aspirin at this time for which the patient has full relief of her chest discomfort at this time  Past Medical History:  Diagnosis Date  . Coronary artery disease   . Hypertension   . Renal disorder    renal stent      Surgical History:  Past Surgical History:  Procedure Laterality Date  . BREAST BIOPSY Right    neg  . CARDIAC SURGERY     cardiac stents placed 2007     Home Meds: Prior to Admission medications   Medication Sig Start Date End Date Taking? Authorizing Provider  aspirin EC 81 MG tablet Take 81 mg by mouth at bedtime.   Yes Historical Provider, MD  cyclobenzaprine (FLEXERIL) 10 MG tablet Take 10 mg by mouth at bedtime as needed for muscle spasms. 12/26/15  Yes Historical Provider, MD  gabapentin  (NEURONTIN) 300 MG capsule Take 300 mg by mouth at bedtime.  11/09/15  Yes Historical Provider, MD  lisinopril (PRINIVIL,ZESTRIL) 10 MG tablet Take 10 mg by mouth every morning.  12/26/15  Yes Historical Provider, MD  metoprolol tartrate (LOPRESSOR) 25 MG tablet Take 25 mg by mouth 2 (two) times daily. 12/26/15  Yes Historical Provider, MD  simvastatin (ZOCOR) 80 MG tablet Take 80 mg by mouth every evening. 12/26/15  Yes Historical Provider, MD    Inpatient Medications:  . aspirin EC  325 mg Oral Daily  . docusate sodium  100 mg Oral BID  . nitroGLYCERIN  0.5 inch Topical Q6H  . rosuvastatin  10 mg Oral Daily  . sodium chloride flush  3 mL Intravenous Q12H   . sodium chloride 75 mL/hr at 01/02/16 2056  . heparin 400 Units/hr (01/03/16 0100)    Allergies:  Allergies  Allergen Reactions  . Penicillins Anaphylaxis    Has patient had a PCN reaction causing immediate rash, facial/tongue/throat swelling, SOB or lightheadedness with hypotension: Yes Has patient had a PCN reaction causing severe rash involving mucus membranes or skin necrosis: No Has patient had a PCN reaction that required hospitalization No Has patient had a PCN reaction occurring within the last 10 years: Yes If all of the above answers are "NO", then may proceed with Cephalosporin use.  [name] [Iodinated Diagnostic Agents] Swelling    Social History   Social History  . Marital status: Divorced    Spouse name: N/A  . Number of children: N/A  . Years of education:  N/A   Occupational History  . Not on file.   Social History Main Topics  . Smoking status: Current Every Day Smoker    Types: Cigarettes  . Smokeless tobacco: Never Used  . Alcohol use Not on file  . Drug use: Unknown  . Sexual activity: Not on file   Other Topics Concern  . Not on file   Social History Narrative  . No narrative on file     Family History  Problem Relation Age of Onset  . Breast cancer Paternal Grandmother      Review of  Systems Positive for Chest pain shortness of breath Negative for: General:  chills, fever, night sweats or weight changes.  Cardiovascular: PND orthopnea syncope dizziness  Dermatological skin lesions rashes Respiratory: Cough congestion Urologic: Frequent urination urination at night and hematuria Abdominal: negative for nausea, vomiting, diarrhea, bright red blood per rectum, melena, or hematemesis Neurologic: negative for visual changes, and/or hearing changes  All other systems reviewed and are otherwise negative except as noted above.  Labs:  Recent Labs  01/02/16 1547 01/02/16 1834 01/02/16 2316 01/03/16 0536  TROPONINI 0.43* 1.68* 3.12* 5.05*   Lab Results  Component Value Date   WBC 7.5 01/03/2016   HGB 13.0 01/03/2016   HCT 37.0 01/03/2016   MCV 97.2 01/03/2016   PLT 243 01/03/2016    Recent Labs Lab 01/03/16 0536  NA 134*  K 3.5  CL 105  CO2 23  BUN 6  CREATININE 0.49  CALCIUM 8.8*  GLUCOSE 107*   No results found for: CHOL, HDL, LDLCALC, TRIG No results found for: DDIMER  Radiology/Studies:  Dg Chest 2 View  Result Date: 01/02/2016 CLINICAL DATA:  65 year old female with mid chest pain since this morning. EXAM: CHEST  2 VIEW COMPARISON:  Chest x-ray 04/11/2005. FINDINGS: Lung volumes are normal. No consolidative airspace disease. No pleural effusions. No pneumothorax. No pulmonary nodule or mass noted. Pulmonary vasculature and the cardiomediastinal silhouette are within normal limits. Atherosclerosis in the thoracic aorta. IMPRESSION: 1.  No radiographic evidence of acute cardiopulmonary disease. 2. Aortic atherosclerosis. Electronically Signed   By: Vinnie Langton M.D.   On: 01/02/2016 16:48    EKG: Normal sinus rhythm with nonspecific ST and T-wave changes  Weights: Filed Weights   01/02/16 1541 01/02/16 2020 01/03/16 0415  Weight: 103 lb (46.7 kg) 101 lb 14.4 oz (46.2 kg) 101 lb 10.1 oz (46.1 kg)     Physical Exam: Blood pressure (!)  127/56, pulse 62, temperature 97.8 F (36.6 C), resp. rate 16, height '5\' 1"'$  (1.549 m), weight 101 lb 10.1 oz (46.1 kg), SpO2 96 %. Body mass index is 19.2 kg/m. General: Well developed, well nourished, in no acute distress. Head eyes ears nose throat: Normocephalic, atraumatic, sclera non-icteric, no xanthomas, nares are without discharge. No apparent thyromegaly and/or mass  Lungs: Normal respiratory effort.  no wheezes, no rales, no rhonchi.  Heart: RRR with normal S1 S2. no murmur gallop, no rub, PMI is normal size and placement, carotid upstroke normal without bruit, jugular venous pressure is normal Abdomen: Soft, non-tender, non-distended with normoactive bowel sounds. No hepatomegaly. No rebound/guarding. No obvious abdominal masses. Abdominal aorta is normal size without bruit Extremities: No edema. no cyanosis, no clubbing, no ulcers  Peripheral : 2+ bilateral upper extremity pulses, 2+ bilateral femoral pulses, 2+ bilateral dorsal pedal pulse Neuro: Alert and oriented. No facial asymmetry. No focal deficit. Moves all extremities spontaneously. Musculoskeletal: Normal muscle tone without kyphosis Psych:  Responds to questions  appropriately with a normal affect.    Assessment: 65 year old female with significant coronary artery disease status post PCI and stent placement in the past with a non-ST elevation myocardial infarction  Plan: 1. Continue heparin for further risk reduction in cardiovascular disease and heart attack 2. Proceed to cardiac catheterization to assess coronary anatomy and further treatment thereof is necessary. Patient understands risk and benefits of cardiac catheterization. This includes possibility of death stroke heart attack infection bleeding or blood clot. Patient is at low risk for conscious sedation  Signed, Corey Skains M.D. [facility] 01/03/2016, 8:31 AM

## 2016-01-03 NOTE — Progress Notes (Signed)
ANTICOAGULATION CONSULT NOTE - Initial Consult  Pharmacy Consult for Heparin Drip  Indication: chest pain/ACS  Allergies  Allergen Reactions  . Penicillins Anaphylaxis    Has patient had a PCN reaction causing immediate rash, facial/tongue/throat swelling, SOB or lightheadedness with hypotension: Yes Has patient had a PCN reaction causing severe rash involving mucus membranes or skin necrosis: No Has patient had a PCN reaction that required hospitalization No Has patient had a PCN reaction occurring within the last 10 years: Yes If all of the above answers are "NO", then may proceed with Cephalosporin use.  [name] [Iodinated Diagnostic Agents] Swelling    Patient Measurements: Height: '5\' 1"'$  (154.9 cm) Weight: 101 lb 10.1 oz (46.1 kg) IBW/kg (Calculated) : 47.8  Vital Signs: Temp: 98.5 F (36.9 C) (08/07 1300) Temp Source: Oral (08/07 1300) BP: 143/63 (08/07 1647) Pulse Rate: 65 (08/07 1647)   Recent Labs  01/02/16 1547 01/02/16 1834 01/02/16 2316 01/03/16 0536 01/03/16 0706  HGB 14.7  --   --  13.0  --   HCT 41.6  --   --  37.0  --   PLT 271  --   --  243  --   APTT  --   --  >160*  --   --   LABPROT  --   --  14.3  --   --   INR  --   --  1.11  --   --   HEPARINUNFRC  --   --   --   --  0.13*  CREATININE 0.72  --   --  0.49  --   TROPONINI 0.43* 1.68* 3.12* 5.05*  --     Estimated Creatinine Clearance: 52.4 mL/min (by C-G formula based on SCr of 0.8 mg/dL).   Medical History: Past Medical History:  Diagnosis Date  . Coronary artery disease   . Hypertension   . Renal disorder    renal stent    Assessment: 65 yo female with chest pain and elevated troponin of 1.68. Pharmacy consulted for heparin dosing for NSTEMI.   Goal of Therapy:  Heparin level 0.3-0.7 units/ml Monitor platelets by anticoagulation protocol: Yes   Plan:  Will bolus 1400 units and increase rate to 550 units/hr. Will obtain follow up anti-Xa level at 1730.   Pharmacy will  continue to monitor and adjust per consult.   Currie Paris  01/03/2016 5:02 PM

## 2016-01-03 NOTE — Progress Notes (Signed)
Patients stored medications returned to son to take home. Appropriate paperwork signed and distributed per protocol.

## 2016-01-03 NOTE — Progress Notes (Signed)
[facility] Encounter Note  Patient: Julia Dominguez / Admit Date: 01/02/2016 / Date of Encounter: 01/03/2016, 1:58 PM   Subjective: No further chest pain or pressure. Patient does have elevated troponin and inferior myocardial infarction Cardiac catheterization shows inferior hypokinesis with ejection fraction of 40% and minimal atherosclerosis of distal left main artery with significant 99% in-stent restenosis of right coronary artery  Review of Systems: Positive for: Resolving chest pain Negative for: Vision change, hearing change, syncope, dizziness, nausea, vomiting,diarrhea, bloody stool, stomach pain, cough, congestion, diaphoresis, urinary frequency, urinary pain,skin lesions, skin rashes Others previously listed  Objective: Telemetry: Normal sinus rhythm Physical Exam: Blood pressure 129/64, pulse 70, temperature 98.5 F (36.9 C), temperature source Oral, resp. rate (!) 21, height '5\' 1"'$  (1.549 m), weight 101 lb 10.1 oz (46.1 kg), SpO2 100 %. Body mass index is 19.2 kg/m. General: Well developed, well nourished, in no acute distress. Head: Normocephalic, atraumatic, sclera non-icteric, no xanthomas, nares are without discharge. Neck: No apparent masses Lungs: Normal respirations with no wheezes, no rhonchi, no rales , no crackles   Heart: Regular rate and rhythm, normal S1 S2, no murmur, no rub, no gallop, PMI is normal size and placement, carotid upstroke normal without bruit, jugular venous pressure normal Abdomen: Soft, non-tender, non-distended with normoactive bowel sounds. No hepatosplenomegaly. Abdominal aorta is normal size without bruit Extremities: No edema, no clubbing, no cyanosis, no ulcers,  Peripheral: 2+ radial, 2+ femoral, 2+ dorsal pedal pulses Neuro: Alert and oriented. Moves all extremities spontaneously. Psych:  Responds to questions appropriately with a normal affect.   Intake/Output Summary (Last 24 hours) at 01/03/16 1358 Last  data filed at 01/03/16 1117  Gross per 24 hour  Intake           801.83 ml  Output             1025 ml  Net          -223.17 ml    Inpatient Medications:  . [START ON 01/04/2016] aspirin  81 mg Oral Pre-Cath  . [MAR Hold] aspirin EC  325 mg Oral Daily  . diphenhydrAMINE      . [START ON 01/04/2016] diphenhydrAMINE  25 mg Intravenous Pre-Cath  . [MAR Hold] docusate sodium  100 mg Oral BID  . famotidine      . [START ON 01/04/2016] famotidine (PEPCID) IV  20 mg Intravenous Pre-Cath  . famotidine  20 mg Oral Once  . [START ON 01/04/2016] methylPREDNISolone (SOLU-MEDROL) injection  125 mg Intravenous Pre-Cath  . methylPREDNISolone sodium succinate      . [MAR Hold] nicotine  21 mg Transdermal Daily  . [MAR Hold] nitroGLYCERIN  0.5 inch Topical Q6H  . [MAR Hold] rosuvastatin  10 mg Oral Daily  . [MAR Hold] sodium chloride flush  3 mL Intravenous Q12H  . sodium chloride flush  3 mL Intravenous Q12H   Infusions:  . sodium chloride 75 mL/hr at 01/03/16 0918  . [START ON 01/04/2016] sodium chloride     Followed by  . [START ON 01/04/2016] sodium chloride    . heparin Stopped (01/03/16 1323)    Labs:  Recent Labs  01/02/16 1547 01/03/16 0536  NA 131* 134*  K 3.6 3.5  CL 98* 105  CO2 22 23  GLUCOSE 137* 107*  BUN 6 6  CREATININE 0.72 0.49  CALCIUM 9.9 8.8*   No results for input(s): AST, ALT, ALKPHOS, BILITOT, PROT, ALBUMIN in the last 72 hours.  Recent Labs  01/02/16  1547 01/03/16 0536  WBC 6.5 7.5  HGB 14.7 13.0  HCT 41.6 37.0  MCV 97.7 97.2  PLT 271 243    Recent Labs  01/02/16 1547 01/02/16 1834 01/02/16 2316 01/03/16 0536  TROPONINI 0.43* 1.68* 3.12* 5.05*   Invalid input(s): POCBNP No results for input(s): HGBA1C in the last 72 hours.   Weights: Filed Weights   01/02/16 2020 01/03/16 0415 01/03/16 1037  Weight: 101 lb 14.4 oz (46.2 kg) 101 lb 10.1 oz (46.1 kg) 101 lb 10.1 oz (46.1 kg)     Radiology/Studies:  Dg Chest 2 View  Result Date:  01/02/2016 CLINICAL DATA:  65 year old female with mid chest pain since this morning. EXAM: CHEST  2 VIEW COMPARISON:  Chest x-ray 04/11/2005. FINDINGS: Lung volumes are normal. No consolidative airspace disease. No pleural effusions. No pneumothorax. No pulmonary nodule or mass noted. Pulmonary vasculature and the cardiomediastinal silhouette are within normal limits. Atherosclerosis in the thoracic aorta. IMPRESSION: 1.  No radiographic evidence of acute cardiopulmonary disease. 2. Aortic atherosclerosis. Electronically Signed   By: Vinnie Langton M.D.   On: 01/02/2016 16:48     Assessment and Recommendation  65 y.o. female with known coronary artery disease status post previous stent in right coronary artery with hyperlipidemia hypertension and tobacco abuse having a non-ST elevation myocardial infarction of the inferior wall with a cardiac catheterization showing in-stent restenosis of right coronary artery and LV systolic dysfunction 1. Proceed to PCI and the stent placement of right coronary artery for non-ST elevation myocardial infarction 2. Plavix and aspirin for further risk reduction of in-stent thrombosis 3. High intensity cholesterol therapy 4. Discontinuation of tobacco abuse likely month primary culprit of above 5. In the cardiac rehabilitation for non-ST elevation myocardial infarction 6. Begin ambulation and follow for need to adjustment of medications and possible use of beta blocker and ACE inhibitor as blood pressure will allow  Signed, Serafina Royals M.D. FACC

## 2016-01-03 NOTE — Care Management (Signed)
Patient admitted for nstemi with troponins trending up to 5.05.  for cardiac cath 8/7.

## 2016-01-03 NOTE — Progress Notes (Signed)
Patient returned from cath lab. R groin site WNL, no evidence of hematoma, bleeding or ecchymosis. R pedal pulse palpable, +2. VSS, no reports of pain. Stent and mynx card given to patient for records and keeping.

## 2016-01-03 NOTE — Progress Notes (Signed)
Per Dr. Nehemiah Massed, d/c angiomax gtt.

## 2016-01-03 NOTE — Progress Notes (Addendum)
[facility] at [place] NAME: Julia Dominguez    MR#:  [id number]  DATE OF BIRTH:  16-Mar-1951  SUBJECTIVE:    patient denies chest pain  REVIEW OF SYSTEMS:    Review of Systems  Constitutional: Negative.  Negative for chills, fever and malaise/fatigue.  HENT: Negative.  Negative for ear discharge, ear pain, hearing loss, nosebleeds and sore throat.   Eyes: Negative.  Negative for blurred vision and pain.  Respiratory: Negative.  Negative for cough, hemoptysis, shortness of breath and wheezing.   Cardiovascular: Negative.  Negative for chest pain, palpitations and leg swelling.  Gastrointestinal: Negative.  Negative for abdominal pain, blood in stool, diarrhea, nausea and vomiting.  Genitourinary: Negative.  Negative for dysuria.  Musculoskeletal: Negative.  Negative for back pain.  Skin: Negative.   Neurological: Negative for dizziness, tremors, speech change, focal weakness, seizures and headaches.  Endo/Heme/Allergies: Negative.  Does not bruise/bleed easily.  Psychiatric/Behavioral: Negative.  Negative for depression, hallucinations and suicidal ideas.    Tolerating Diet: NPO      DRUG ALLERGIES:   Allergies  Allergen Reactions  . Penicillins Anaphylaxis    Has patient had a PCN reaction causing immediate rash, facial/tongue/throat swelling, SOB or lightheadedness with hypotension: Yes Has patient had a PCN reaction causing severe rash involving mucus membranes or skin necrosis: No Has patient had a PCN reaction that required hospitalization No Has patient had a PCN reaction occurring within the last 10 years: Yes If all of the above answers are "NO", then may proceed with Cephalosporin use.  Samuel Germany Dye [Iodinated Diagnostic Agents] Swelling    VITALS:  Blood pressure 133/61, pulse 68, temperature 98.3 F (36.8 C), temperature source Oral, resp. rate 16, height '5\' 1"'$  (1.549 m), weight 46.1 kg (101 lb 10.1 oz), SpO2 98  %.  PHYSICAL EXAMINATION:   Physical Exam  Constitutional: She is oriented to person, place, and time and well-developed, well-nourished, and in no distress. No distress.  HENT:  Head: Normocephalic.  Eyes: No scleral icterus.  Neck: Normal range of motion. Neck supple. No JVD present. No tracheal deviation present.  Cardiovascular: Normal rate, regular rhythm and normal heart sounds.  Exam reveals no gallop and no friction rub.   No murmur heard. Pulmonary/Chest: Effort normal and breath sounds normal. No respiratory distress. She has no wheezes. She has no rales. She exhibits no tenderness.  Abdominal: Soft. Bowel sounds are normal. She exhibits no distension and no mass. There is no tenderness. There is no rebound and no guarding.  Musculoskeletal: Normal range of motion. She exhibits no edema.  Neurological: She is alert and oriented to person, place, and time.  Skin: Skin is warm. No rash noted. No erythema.  Psychiatric: Affect and judgment normal.      LABORATORY PANEL:   CBC  Recent Labs Lab 01/03/16 0536  WBC 7.5  HGB 13.0  HCT 37.0  PLT 243   ------------------------------------------------------------------------------------------------------------------  Chemistries   Recent Labs Lab 01/03/16 0536  NA 134*  K 3.5  CL 105  CO2 23  GLUCOSE 107*  BUN 6  CREATININE 0.49  CALCIUM 8.8*   ------------------------------------------------------------------------------------------------------------------  Cardiac Enzymes  Recent Labs Lab 01/02/16 1834 01/02/16 2316 01/03/16 0536  TROPONINI 1.68* 3.12* 5.05*   ------------------------------------------------------------------------------------------------------------------  RADIOLOGY:  Dg Chest 2 View  Result Date: 01/02/2016 CLINICAL DATA:  65 year old female with mid chest pain since this morning. EXAM: CHEST  2 VIEW COMPARISON:  Chest x-ray 04/11/2005. FINDINGS: Lung volumes are  normal. No  consolidative airspace disease. No pleural effusions. No pneumothorax. No pulmonary nodule or mass noted. Pulmonary vasculature and the cardiomediastinal silhouette are within normal limits. Atherosclerosis in the thoracic aorta. IMPRESSION: 1.  No radiographic evidence of acute cardiopulmonary disease. 2. Aortic atherosclerosis. Electronically Signed   By: Vinnie Langton M.D.   On: 01/02/2016 16:48     ASSESSMENT AND PLAN:   65 year old female with known CAD and previous stent to presents with chest pain about have not seen elevation MI.  1. Non-ST elevation MI with troponin max of 5. Next line patient will undergo cardiac catheterization this afternoon. Continue heparin drip, aspirin and statin. Heart rate low to add beta blocker. Further recommendation after cardiac catheter physician.  2. History of COPD: Patient has no signs of exacerbation.  3. Tobacco dependence: Counseled for 3 minutes to stop smoking, specially in the light of above events.. I will add nicotine patch.  4. Hyponatremia: This is improved with IV fluids.   Management plans discussed with the patient and she is in agreement.  CODE STATUS: full  TOTAL TIME TAKING CARE OF THIS PATIENT: 30 minutes.     POSSIBLE D/C 1-2 days, DEPENDING ON CLINICAL CONDITION.   Annissa Andreoni M.D on 01/03/2016 at 11:43 AM  Between 7am to 6pm - Pager - [phone] After 6pm go to [email or web address] - password [email or web address] [facility]  Office  [phone]  CC: Primary care physician; No primary care provider on file.  Note: This dictation was prepared with Dragon dictation along with smaller phrase technology. Any transcriptional errors that result from this process are unintentional.

## 2016-01-03 NOTE — Progress Notes (Signed)
Heparin drip shut down at 0000 per Merrilee Seashore ( the pharmacist) order due to APTT >160. New order from the pharmacist to resumed running it at 0100 with a new rate of 4 mL/hr. Troponin level came 3.12, Dr. Ollen Bowl notified but no new order received. Will continue to monitor.

## 2016-01-03 NOTE — Progress Notes (Signed)
Heparin drip resumed at 0100 and is running at 4 mL/hr. No bleeding/hematoma or any new  bruises noted. Will continue to monitor.

## 2016-01-04 DIAGNOSIS — I214 Non-ST elevation (NSTEMI) myocardial infarction: Secondary | ICD-10-CM

## 2016-01-04 LAB — BASIC METABOLIC PANEL
Anion gap: 7 (ref 5–15)
BUN: 8 mg/dL (ref 6–20)
CHLORIDE: 106 mmol/L (ref 101–111)
CO2: 21 mmol/L — AB (ref 22–32)
CREATININE: 0.68 mg/dL (ref 0.44–1.00)
Calcium: 9.2 mg/dL (ref 8.9–10.3)
GFR calc non Af Amer: >60 mL/min (ref >60)
GLUCOSE: 127 mg/dL — AB (ref 65–99)
Potassium: 3.5 mmol/L (ref 3.5–5.1)
Sodium: 134 mmol/L — ABNORMAL LOW (ref 135–145)

## 2016-01-04 LAB — CBC
HCT: 35.1 % (ref 35.0–47.0)
HEMOGLOBIN: 12.6 g/dL (ref 12.0–16.0)
MCH: 34.8 pg — AB (ref 26.0–34.0)
MCHC: 35.8 g/dL (ref 32.0–36.0)
MCV: 97.1 fL (ref 80.0–100.0)
Platelets: 238 10*3/uL (ref 150–440)
RBC: 3.62 MIL/uL — AB (ref 3.80–5.20)
RDW: 14.3 % (ref 11.5–14.5)
WBC: 9.6 10*3/uL (ref 3.6–11.0)

## 2016-01-04 MED ORDER — ROSUVASTATIN CALCIUM 10 MG PO TABS: 10.0000 mg | ORAL_TABLET | Freq: Every day | ORAL | 0 refills | Status: DC

## 2016-01-04 MED ORDER — HYDROCODONE-ACETAMINOPHEN 5-325 MG PO TABS: 1.0000 | ORAL_TABLET | ORAL | 0 refills | Status: DC | PRN

## 2016-01-04 MED ORDER — TICAGRELOR 90 MG PO TABS: 90.0000 mg | ORAL_TABLET | Freq: Two times a day (BID) | ORAL | 0 refills | Status: DC

## 2016-01-04 NOTE — Discharge Summary (Signed)
[facility] at [place] NAME: Julia Dominguez    MR#:  [id number]  DATE OF BIRTH:  [birth year]  DATE OF ADMISSION:  01/02/2016 ADMITTING PHYSICIAN: Epifanio Lesches, MD  DATE OF DISCHARGE: 01/04/16  PRIMARY CARE PHYSICIAN: No primary care provider on file.    ADMISSION DIAGNOSIS:  Elevated troponin [R79.89] NSTEMI (non-ST elevated myocardial infarction) ([place]) [I21.4]  DISCHARGE DIAGNOSIS:  Active Problems:   NSTEMI (non-ST elevated myocardial infarction) ([place]) Status post PCI and stent placement right coronary  SECONDARY DIAGNOSIS:   Past Medical History:  Diagnosis Date  . Coronary artery disease   . Hypertension   . Renal disorder    renal stent    HOSPITAL COURSE:  Julia Dominguez  is a 65 y.o. female admitted 01/02/2016 with chief complaint Chest Pain . Please see H&P performed by Epifanio Lesches, MD for further information. Patient presents to hospital with the above findings have elevated troponin placed on anticoagulation with cardiology following. She was taken to cardiac catheterization where she underwent stent placement to right coronary artery at site of previous stent. She was placed on Brilinta and aspirin to prevent further in-stent thrombosis. Remains chest pain-free  DISCHARGE CONDITIONS:   Stable  CONSULTS OBTAINED:  Treatment Team:  Corey Skains, MD  DRUG ALLERGIES:   Allergies  Allergen Reactions  . Penicillins Anaphylaxis    Has patient had a PCN reaction causing immediate rash, facial/tongue/throat swelling, SOB or lightheadedness with hypotension: Yes Has patient had a PCN reaction causing severe rash involving mucus membranes or skin necrosis: No Has patient had a PCN reaction that required hospitalization No Has patient had a PCN reaction occurring within the last 10 years: Yes If all of the above answers are "NO", then may proceed with Cephalosporin use.  [name] [[employer]] Swelling    DISCHARGE MEDICATIONS:   Current Discharge Medication List    START taking these medications   Details  HYDROcodone-acetaminophen (NORCO/VICODIN) 5-325 MG tablet Take 1-2 tablets by mouth every 4 (four) hours as needed for moderate pain. Qty: 30 tablet, Refills: 0    rosuvastatin (CRESTOR) 10 MG tablet Take 1 tablet (10 mg total) by mouth daily. Qty: 30 tablet, Refills: 0    ticagrelor (BRILINTA) 90 MG TABS tablet Take 1 tablet (90 mg total) by mouth 2 (two) times daily. Qty: 60 tablet, Refills: 0      CONTINUE these medications which have NOT CHANGED   Details  aspirin EC 81 MG tablet Take 81 mg by mouth at bedtime.    cyclobenzaprine (FLEXERIL) 10 MG tablet Take 10 mg by mouth at bedtime as needed for muscle spasms.    gabapentin (NEURONTIN) 300 MG capsule Take 300 mg by mouth at bedtime.     lisinopril (PRINIVIL,ZESTRIL) 10 MG tablet Take 10 mg by mouth every morning.     metoprolol tartrate (LOPRESSOR) 25 MG tablet Take 25 mg by mouth 2 (two) times daily.      STOP taking these medications     simvastatin (ZOCOR) 80 MG tablet          DISCHARGE INSTRUCTIONS:    DIET:  Cardiac diet  DISCHARGE CONDITION:  Stable  ACTIVITY:  Activity as tolerated  OXYGEN:  Home Oxygen: No.   Oxygen Delivery: room air  DISCHARGE LOCATION:  home   If you experience worsening of your admission symptoms, develop shortness of breath, life threatening emergency, suicidal or homicidal thoughts you must seek medical attention  immediately by calling 911 or calling your MD immediately  if symptoms less severe.  You Must read complete instructions/literature along with all the possible adverse reactions/side effects for all the Medicines you take and that have been prescribed to you. Take any new Medicines after you have completely understood and accpet all the possible adverse reactions/side effects.   Please note  You were cared for by a hospitalist during  your hospital stay. If you have any questions about your discharge medications or the care you received while you were in the hospital after you are discharged, you can call the unit and asked to speak with the hospitalist on call if the hospitalist that took care of you is not available. Once you are discharged, your primary care physician will handle any further medical issues. Please note that NO REFILLS for any discharge medications will be authorized once you are discharged, as it is imperative that you return to your primary care physician (or establish a relationship with a primary care physician if you do not have one) for your aftercare needs so that they can reassess your need for medications and monitor your lab values.    On the day of Discharge:   VITAL SIGNS:  Blood pressure (!) 120/58, pulse 70, temperature 98.2 F (36.8 C), temperature source Oral, resp. rate 16, height '5\' 1"'$  (1.549 m), weight 103 lb 6.4 oz (46.9 kg), SpO2 98 %.  I/O:   Intake/Output Summary (Last 24 hours) at 01/04/16 1111 Last data filed at 01/04/16 0938  Gross per 24 hour  Intake              360 ml  Output             1275 ml  Net             -915 ml    PHYSICAL EXAMINATION:  GENERAL:  65 y.o.-year-old patient lying in the bed with no acute distress.  EYES: Pupils equal, round, reactive to light and accommodation. No scleral icterus. Extraocular muscles intact.  HEENT: Head atraumatic, normocephalic. Oropharynx and nasopharynx clear.  NECK:  Supple, no jugular venous distention. No thyroid enlargement, no tenderness.  LUNGS: Normal breath sounds bilaterally, no wheezing, rales,rhonchi or crepitation. No use of accessory muscles of respiration.  CARDIOVASCULAR: S1, S2 normal. No murmurs, rubs, or gallops.  ABDOMEN: Soft, non-tender, non-distended. Bowel sounds present. No organomegaly or mass.  EXTREMITIES: No pedal edema, cyanosis, or clubbing.  NEUROLOGIC: Cranial nerves II through XII are intact.  Muscle strength 5/5 in all extremities. Sensation intact. Gait not checked.  PSYCHIATRIC: The patient is alert and oriented x 3.  SKIN: No obvious rash, lesion, or ulcer.   DATA REVIEW:   CBC  Recent Labs Lab 01/04/16 0524  WBC 9.6  HGB 12.6  HCT 35.1  PLT 238    Chemistries   Recent Labs Lab 01/04/16 0524  NA 134*  K 3.5  CL 106  CO2 21*  GLUCOSE 127*  BUN 8  CREATININE 0.68  CALCIUM 9.2    Cardiac Enzymes  Recent Labs Lab 01/03/16 0536  TROPONINI 5.05*    Microbiology Results  No results found for this or any previous visit.  RADIOLOGY:  Dg Chest 2 View  Result Date: 01/02/2016 CLINICAL DATA:  66 year old female with mid chest pain since this morning. EXAM: CHEST  2 VIEW COMPARISON:  Chest x-ray 04/11/2005. FINDINGS: Lung volumes are normal. No consolidative airspace disease. No pleural effusions. No pneumothorax. No pulmonary nodule or  mass noted. Pulmonary vasculature and the cardiomediastinal silhouette are within normal limits. Atherosclerosis in the thoracic aorta. IMPRESSION: 1.  No radiographic evidence of acute cardiopulmonary disease. 2. Aortic atherosclerosis. Electronically Signed   By: Vinnie Langton M.D.   On: 01/02/2016 16:48     Management plans discussed with the patient, family and they are in agreement.  CODE STATUS:     Code Status Orders        Start     Ordered   01/02/16 1724  Full code  Continuous     01/02/16 1727    Code Status History    Date Active Date Inactive Code Status Order ID Comments User Context   This patient has a current code status but no historical code status.      TOTAL TIME TAKING CARE OF THIS PATIENT: 32 minutes.    Hower,  Karenann Cai.D on 01/04/2016 at 11:11 AM  Between 7am to 6pm - Pager - [phone]  After 6pm go to [email or web address] - [employer] [facility]  Office  [phone]  CC: Primary care physician; No primary care provider on file.

## 2016-01-04 NOTE — Care Management (Signed)
patient confirms that she has pharmacy coverage through her insurance.  She is to discharge on Brilinta.  Provided coupon

## 2016-01-04 NOTE — Progress Notes (Signed)
Pt. Discharged to home via wc. Discharge instructions and medication regimen reviewed at bedside with patient. Pt. verbalizes understanding of instructions and medication regimen. Prescriptions included with d/c papers. R groin site intact & stable, bandaid in place. Patient assessment unchanged from this morning. TELE and IV discontinued per policy.

## 2016-01-04 NOTE — Progress Notes (Signed)
[facility] Encounter Note  Patient: Julia Dominguez / Admit Date: 01/02/2016 / Date of Encounter: 01/04/2016, 8:35 AM   Subjective: No further chest pain or pressure. Patient does have elevated troponin and inferior myocardial infarction Cardiac catheterization shows inferior hypokinesis with ejection fraction of 40% and minimal atherosclerosis of distal left main artery with significant 99% in-stent restenosis of right coronary artery  Review of Systems: Positive for: Resolving chest pain Negative for: Vision change, hearing change, syncope, dizziness, nausea, vomiting,diarrhea, bloody stool, stomach pain, cough, congestion, diaphoresis, urinary frequency, urinary pain,skin lesions, skin rashes Others previously listed  Objective: Telemetry: Normal sinus rhythm Physical Exam: Blood pressure (!) 120/58, pulse 70, temperature 98.2 F (36.8 C), temperature source Oral, resp. rate 16, height '5\' 1"'$  (1.549 m), weight 103 lb 6.4 oz (46.9 kg), SpO2 98 %. Body mass index is 19.54 kg/m. General: Well developed, well nourished, in no acute distress. Head: Normocephalic, atraumatic, sclera non-icteric, no xanthomas, nares are without discharge. Neck: No apparent masses Lungs: Normal respirations with no wheezes, no rhonchi, no rales , no crackles   Heart: Regular rate and rhythm, normal S1 S2, no murmur, no rub, no gallop, PMI is normal size and placement, carotid upstroke normal without bruit, jugular venous pressure normal Abdomen: Soft, non-tender, non-distended with normoactive bowel sounds. No hepatosplenomegaly. Abdominal aorta is normal size without bruit Extremities: No edema, no clubbing, no cyanosis, no ulcers,  Peripheral: 2+ radial, 2+ femoral, 2+ dorsal pedal pulses Neuro: Alert and oriented. Moves all extremities spontaneously. Psych:  Responds to questions appropriately with a normal affect.   Intake/Output Summary (Last 24 hours) at 01/04/16 0835 Last  data filed at 01/04/16 0802  Gross per 24 hour  Intake              120 ml  Output             1325 ml  Net            -1205 ml    Inpatient Medications:  . aspirin  81 mg Oral Daily  . docusate sodium  100 mg Oral BID  . nicotine  21 mg Transdermal Daily  . nitroGLYCERIN  0.5 inch Topical Q6H  . rosuvastatin  10 mg Oral Daily  . sodium chloride flush  3 mL Intravenous Q12H  . sodium chloride flush  3 mL Intravenous Q12H  . ticagrelor  90 mg Oral BID   Infusions:  . sodium chloride 75 mL/hr at 01/04/16 0635    Labs:  Recent Labs  01/03/16 0536 01/04/16 0524  NA 134* 134*  K 3.5 3.5  CL 105 106  CO2 23 21*  GLUCOSE 107* 127*  BUN 6 8  CREATININE 0.49 0.68  CALCIUM 8.8* 9.2   No results for input(s): AST, ALT, ALKPHOS, BILITOT, PROT, ALBUMIN in the last 72 hours.  Recent Labs  01/03/16 0536 01/04/16 0524  WBC 7.5 9.6  HGB 13.0 12.6  HCT 37.0 35.1  MCV 97.2 97.1  PLT 243 238    Recent Labs  01/02/16 1547 01/02/16 1834 01/02/16 2316 01/03/16 0536  TROPONINI 0.43* 1.68* 3.12* 5.05*   Invalid input(s): POCBNP No results for input(s): HGBA1C in the last 72 hours.   Weights: Filed Weights   01/03/16 0415 01/03/16 1037 01/04/16 0418  Weight: 101 lb 10.1 oz (46.1 kg) 101 lb 10.1 oz (46.1 kg) 103 lb 6.4 oz (46.9 kg)     Radiology/Studies:  Dg Chest 2 View  Result Date: 01/02/2016 CLINICAL DATA:  65 year old female with mid chest pain since this morning. EXAM: CHEST  2 VIEW COMPARISON:  Chest x-ray 04/11/2005. FINDINGS: Lung volumes are normal. No consolidative airspace disease. No pleural effusions. No pneumothorax. No pulmonary nodule or mass noted. Pulmonary vasculature and the cardiomediastinal silhouette are within normal limits. Atherosclerosis in the thoracic aorta. IMPRESSION: 1.  No radiographic evidence of acute cardiopulmonary disease. 2. Aortic atherosclerosis. Electronically Signed   By: Vinnie Langton M.D.   On: 01/02/2016 16:48      Assessment and Recommendation  65 y.o. female with known coronary artery disease status post previous stent in right coronary artery with hyperlipidemia hypertension and tobacco abuse having a non-ST elevation myocardial infarction of the inferior wall with a cardiac catheterization showing in-stent restenosis of right coronary artery and LV systolic dysfunction 1.No further cardiac diagnostics at this time with successful PCI and stent placement of the right coronary artery at previous stent site 2. brilinta and aspirin for further risk reduction of in-stent thrombosis 3. High intensity cholesterol therapy with high-dose Crestor 4. Discontinuation of tobacco abuse likely month primary culprit of above 5. In the cardiac rehabilitation for non-ST elevation myocardial infarction 6. Begin ambulation and follow for need to adjustment of medications and possible use of beta blocker and ACE inhibitor as blood pressure will allow with possible discharged home today with follow-up next week  Signed, Serafina Royals M.D. FACC

## 2016-01-04 NOTE — Progress Notes (Signed)
Patient ambulating around nurses station x10+ (>2,000 ft) without difficulty. No reports of chest pain/discomfort/pressure or SOB. Will cont. to monitor.

## 2016-08-29 ENCOUNTER — Other Ambulatory Visit: Payer: Self-pay | Admitting: Internal Medicine

## 2016-08-29 DIAGNOSIS — Z1231 Encounter for screening mammogram for malignant neoplasm of breast: Secondary | ICD-10-CM

## 2016-09-20 ENCOUNTER — Other Ambulatory Visit: Payer: Self-pay | Admitting: Internal Medicine

## 2016-09-20 ENCOUNTER — Ambulatory Visit
Admission: RE | Admit: 2016-09-20 | Discharge: 2016-09-20 | Disposition: A | Discharge status: Home / Self Care | Payer: BC Managed Care – PPO | Source: Ambulatory Visit | Attending: Internal Medicine | Admitting: Internal Medicine

## 2016-09-20 DIAGNOSIS — Z1231 Encounter for screening mammogram for malignant neoplasm of breast: Secondary | ICD-10-CM | POA: Diagnosis not present

## 2017-06-19 ENCOUNTER — Other Ambulatory Visit: Payer: Self-pay | Admitting: Internal Medicine

## 2017-06-19 DIAGNOSIS — Z1231 Encounter for screening mammogram for malignant neoplasm of breast: Secondary | ICD-10-CM

## 2017-09-24 ENCOUNTER — Ambulatory Visit
Admission: RE | Admit: 2017-09-24 | Discharge: 2017-09-24 | Disposition: A | Discharge status: Home / Self Care | Payer: Medicare Other | Source: Ambulatory Visit | Attending: Internal Medicine | Admitting: Internal Medicine

## 2017-09-24 DIAGNOSIS — Z1231 Encounter for screening mammogram for malignant neoplasm of breast: Secondary | ICD-10-CM | POA: Diagnosis present

## 2017-09-25 ENCOUNTER — Other Ambulatory Visit: Payer: Self-pay | Admitting: Internal Medicine

## 2017-09-25 DIAGNOSIS — R945 Abnormal results of liver function studies: Secondary | ICD-10-CM

## 2017-09-28 ENCOUNTER — Ambulatory Visit
Admission: RE | Admit: 2017-09-28 | Discharge: 2017-09-28 | Disposition: A | Discharge status: Home / Self Care | Payer: Medicare Other | Source: Ambulatory Visit | Attending: Internal Medicine | Admitting: Internal Medicine

## 2017-09-28 DIAGNOSIS — R945 Abnormal results of liver function studies: Secondary | ICD-10-CM | POA: Diagnosis not present

## 2017-09-28 DIAGNOSIS — N200 Calculus of kidney: Secondary | ICD-10-CM | POA: Insufficient documentation

## 2018-02-05 ENCOUNTER — Other Ambulatory Visit: Payer: Self-pay | Admitting: Internal Medicine

## 2018-02-05 DIAGNOSIS — R918 Other nonspecific abnormal finding of lung field: Secondary | ICD-10-CM

## 2018-11-05 ENCOUNTER — Other Ambulatory Visit: Payer: Self-pay | Admitting: Internal Medicine

## 2018-11-05 DIAGNOSIS — Z1231 Encounter for screening mammogram for malignant neoplasm of breast: Secondary | ICD-10-CM

## 2018-11-18 ENCOUNTER — Ambulatory Visit
Admission: RE | Admit: 2018-11-18 | Discharge: 2018-11-18 | Disposition: A | Discharge status: Home / Self Care | Payer: Medicare Other | Source: Ambulatory Visit | Attending: Internal Medicine | Admitting: Internal Medicine

## 2018-11-18 ENCOUNTER — Other Ambulatory Visit: Payer: Self-pay

## 2018-11-18 DIAGNOSIS — Z1231 Encounter for screening mammogram for malignant neoplasm of breast: Secondary | ICD-10-CM

## 2019-05-01 ENCOUNTER — Other Ambulatory Visit: Payer: Self-pay

## 2019-05-01 DIAGNOSIS — Z20822 Contact with and (suspected) exposure to covid-19: Secondary | ICD-10-CM

## 2019-05-03 LAB — NOVEL CORONAVIRUS, NAA: SARS-CoV-2, NAA: NOT DETECTED

## 2019-10-10 DIAGNOSIS — F17200 Nicotine dependence, unspecified, uncomplicated: Secondary | ICD-10-CM | POA: Insufficient documentation

## 2019-10-10 DIAGNOSIS — M810 Age-related osteoporosis without current pathological fracture: Secondary | ICD-10-CM | POA: Insufficient documentation

## 2019-10-29 ENCOUNTER — Other Ambulatory Visit: Payer: Self-pay | Admitting: Internal Medicine

## 2019-10-29 DIAGNOSIS — Z1231 Encounter for screening mammogram for malignant neoplasm of breast: Secondary | ICD-10-CM

## 2019-12-03 ENCOUNTER — Ambulatory Visit
Admission: RE | Admit: 2019-12-03 | Discharge: 2019-12-03 | Disposition: A | Discharge status: Home / Self Care | Payer: Medicare PPO | Source: Ambulatory Visit | Attending: Internal Medicine | Admitting: Internal Medicine

## 2019-12-03 DIAGNOSIS — Z1231 Encounter for screening mammogram for malignant neoplasm of breast: Secondary | ICD-10-CM | POA: Insufficient documentation

## 2020-07-23 LAB — EXTERNAL GENERIC LAB PROCEDURE: COLOGUARD: NEGATIVE

## 2020-10-19 ENCOUNTER — Other Ambulatory Visit: Payer: Self-pay | Admitting: Internal Medicine

## 2020-10-19 DIAGNOSIS — Z1231 Encounter for screening mammogram for malignant neoplasm of breast: Secondary | ICD-10-CM

## 2020-11-09 DIAGNOSIS — I10 Essential (primary) hypertension: Secondary | ICD-10-CM | POA: Diagnosis not present

## 2020-11-09 DIAGNOSIS — E538 Deficiency of other specified B group vitamins: Secondary | ICD-10-CM | POA: Diagnosis not present

## 2020-11-09 DIAGNOSIS — F17200 Nicotine dependence, unspecified, uncomplicated: Secondary | ICD-10-CM | POA: Diagnosis not present

## 2020-11-09 DIAGNOSIS — E782 Mixed hyperlipidemia: Secondary | ICD-10-CM | POA: Diagnosis not present

## 2020-12-03 ENCOUNTER — Ambulatory Visit
Admission: RE | Admit: 2020-12-03 | Discharge: 2020-12-03 | Disposition: A | Discharge status: Home / Self Care | Payer: Medicare PPO | Source: Ambulatory Visit | Attending: Internal Medicine | Admitting: Internal Medicine

## 2020-12-03 ENCOUNTER — Other Ambulatory Visit: Payer: Self-pay

## 2020-12-03 DIAGNOSIS — Z1231 Encounter for screening mammogram for malignant neoplasm of breast: Secondary | ICD-10-CM | POA: Diagnosis not present

## 2021-03-11 DIAGNOSIS — E538 Deficiency of other specified B group vitamins: Secondary | ICD-10-CM | POA: Diagnosis not present

## 2021-03-11 DIAGNOSIS — I10 Essential (primary) hypertension: Secondary | ICD-10-CM | POA: Diagnosis not present

## 2021-03-11 DIAGNOSIS — E782 Mixed hyperlipidemia: Secondary | ICD-10-CM | POA: Diagnosis not present

## 2021-03-11 DIAGNOSIS — Z23 Encounter for immunization: Secondary | ICD-10-CM | POA: Diagnosis not present

## 2021-03-11 DIAGNOSIS — F17200 Nicotine dependence, unspecified, uncomplicated: Secondary | ICD-10-CM | POA: Diagnosis not present

## 2021-03-11 DIAGNOSIS — J449 Chronic obstructive pulmonary disease, unspecified: Secondary | ICD-10-CM | POA: Diagnosis not present

## 2021-07-12 DIAGNOSIS — E559 Vitamin D deficiency, unspecified: Secondary | ICD-10-CM | POA: Diagnosis not present

## 2021-07-12 DIAGNOSIS — D381 Neoplasm of uncertain behavior of trachea, bronchus and lung: Secondary | ICD-10-CM | POA: Diagnosis not present

## 2021-07-12 DIAGNOSIS — I10 Essential (primary) hypertension: Secondary | ICD-10-CM | POA: Diagnosis not present

## 2021-07-12 DIAGNOSIS — E782 Mixed hyperlipidemia: Secondary | ICD-10-CM | POA: Diagnosis not present

## 2021-07-12 DIAGNOSIS — R634 Abnormal weight loss: Secondary | ICD-10-CM | POA: Diagnosis not present

## 2021-07-12 DIAGNOSIS — D519 Vitamin B12 deficiency anemia, unspecified: Secondary | ICD-10-CM | POA: Diagnosis not present

## 2021-08-16 DIAGNOSIS — E782 Mixed hyperlipidemia: Secondary | ICD-10-CM | POA: Diagnosis not present

## 2021-08-16 DIAGNOSIS — R634 Abnormal weight loss: Secondary | ICD-10-CM | POA: Diagnosis not present

## 2021-08-16 DIAGNOSIS — J449 Chronic obstructive pulmonary disease, unspecified: Secondary | ICD-10-CM | POA: Diagnosis not present

## 2021-08-16 DIAGNOSIS — Z87448 Personal history of other diseases of urinary system: Secondary | ICD-10-CM | POA: Diagnosis not present

## 2021-08-16 DIAGNOSIS — E785 Hyperlipidemia, unspecified: Secondary | ICD-10-CM | POA: Diagnosis not present

## 2021-08-16 DIAGNOSIS — Z122 Encounter for screening for malignant neoplasm of respiratory organs: Secondary | ICD-10-CM | POA: Diagnosis not present

## 2021-08-16 DIAGNOSIS — I2510 Atherosclerotic heart disease of native coronary artery without angina pectoris: Secondary | ICD-10-CM | POA: Diagnosis not present

## 2021-08-16 DIAGNOSIS — I10 Essential (primary) hypertension: Secondary | ICD-10-CM | POA: Diagnosis not present

## 2021-08-16 DIAGNOSIS — I701 Atherosclerosis of renal artery: Secondary | ICD-10-CM | POA: Diagnosis not present

## 2021-08-16 DIAGNOSIS — Z114 Encounter for screening for human immunodeficiency virus [HIV]: Secondary | ICD-10-CM | POA: Diagnosis not present

## 2021-08-16 DIAGNOSIS — Z87442 Personal history of urinary calculi: Secondary | ICD-10-CM | POA: Diagnosis not present

## 2021-08-16 DIAGNOSIS — N200 Calculus of kidney: Secondary | ICD-10-CM | POA: Diagnosis not present

## 2021-08-16 DIAGNOSIS — R6881 Early satiety: Secondary | ICD-10-CM | POA: Diagnosis not present

## 2021-08-17 ENCOUNTER — Other Ambulatory Visit: Payer: Self-pay | Admitting: Infectious Diseases

## 2021-08-17 DIAGNOSIS — N200 Calculus of kidney: Secondary | ICD-10-CM | POA: Diagnosis not present

## 2021-08-17 DIAGNOSIS — Z114 Encounter for screening for human immunodeficiency virus [HIV]: Secondary | ICD-10-CM | POA: Diagnosis not present

## 2021-08-17 DIAGNOSIS — E782 Mixed hyperlipidemia: Secondary | ICD-10-CM | POA: Diagnosis not present

## 2021-08-17 DIAGNOSIS — R3129 Other microscopic hematuria: Secondary | ICD-10-CM

## 2021-08-17 DIAGNOSIS — R634 Abnormal weight loss: Secondary | ICD-10-CM | POA: Diagnosis not present

## 2021-08-17 DIAGNOSIS — R9389 Abnormal findings on diagnostic imaging of other specified body structures: Secondary | ICD-10-CM

## 2021-08-17 DIAGNOSIS — I10 Essential (primary) hypertension: Secondary | ICD-10-CM | POA: Diagnosis not present

## 2021-08-29 ENCOUNTER — Ambulatory Visit
Admission: RE | Admit: 2021-08-29 | Discharge: 2021-08-29 | Disposition: A | Discharge status: Home / Self Care | Payer: Medicare PPO | Source: Ambulatory Visit | Attending: Infectious Diseases | Admitting: Infectious Diseases

## 2021-08-29 DIAGNOSIS — R634 Abnormal weight loss: Secondary | ICD-10-CM | POA: Diagnosis not present

## 2021-08-29 DIAGNOSIS — N200 Calculus of kidney: Secondary | ICD-10-CM | POA: Diagnosis not present

## 2021-08-29 DIAGNOSIS — I2510 Atherosclerotic heart disease of native coronary artery without angina pectoris: Secondary | ICD-10-CM | POA: Diagnosis not present

## 2021-08-29 DIAGNOSIS — R3129 Other microscopic hematuria: Secondary | ICD-10-CM | POA: Insufficient documentation

## 2021-08-29 DIAGNOSIS — R9389 Abnormal findings on diagnostic imaging of other specified body structures: Secondary | ICD-10-CM | POA: Insufficient documentation

## 2021-08-29 DIAGNOSIS — Q278 Other specified congenital malformations of peripheral vascular system: Secondary | ICD-10-CM | POA: Diagnosis not present

## 2021-08-29 DIAGNOSIS — J432 Centrilobular emphysema: Secondary | ICD-10-CM | POA: Diagnosis not present

## 2021-08-29 MED ORDER — IOHEXOL 300 MG/ML  SOLN
60.0000 mL | Freq: Once | INTRAMUSCULAR | Status: AC | PRN
  Administered 2021-08-29: 60 mL via INTRAVENOUS

## 2021-08-30 ENCOUNTER — Ambulatory Visit: Payer: Self-pay | Admitting: Urology

## 2021-08-31 ENCOUNTER — Other Ambulatory Visit: Payer: Self-pay | Admitting: *Deleted

## 2021-08-31 ENCOUNTER — Ambulatory Visit: Payer: Medicare PPO | Admitting: Urology

## 2021-08-31 ENCOUNTER — Encounter: Payer: Self-pay | Admitting: Urology

## 2021-08-31 ENCOUNTER — Other Ambulatory Visit: Payer: Self-pay | Admitting: Urology

## 2021-08-31 ENCOUNTER — Telehealth: Payer: Self-pay

## 2021-08-31 ENCOUNTER — Other Ambulatory Visit
Admission: RE | Admit: 2021-08-31 | Discharge: 2021-08-31 | Disposition: A | Discharge status: Home / Self Care | Payer: Medicare PPO | Attending: Urology | Admitting: Urology

## 2021-08-31 VITALS — BP 199/80 | HR 64 | Ht 61.54 in | Wt 82.2 lb

## 2021-08-31 DIAGNOSIS — N200 Calculus of kidney: Secondary | ICD-10-CM

## 2021-08-31 DIAGNOSIS — N132 Hydronephrosis with renal and ureteral calculous obstruction: Secondary | ICD-10-CM

## 2021-08-31 DIAGNOSIS — R3989 Other symptoms and signs involving the genitourinary system: Secondary | ICD-10-CM

## 2021-08-31 LAB — URINALYSIS, COMPLETE (UACMP) WITH MICROSCOPIC
Bilirubin Urine: NEGATIVE
Glucose, UA: NEGATIVE mg/dL
Ketones, ur: NEGATIVE mg/dL
Nitrite: NEGATIVE
Protein, ur: 30 mg/dL — AB
Specific Gravity, Urine: 1.015 (ref 1.005–1.030)
WBC, UA: >50 WBC/hpf (ref 0–5)
pH: 7 (ref 5.0–8.0)

## 2021-08-31 MED ORDER — SULFAMETHOXAZOLE-TRIMETHOPRIM 800-160 MG PO TABS: 1.0000 | ORAL_TABLET | Freq: Two times a day (BID) | ORAL | 0 refills | Status: DC

## 2021-08-31 NOTE — Progress Notes (Signed)
? ?08/31/21 ?12:35 PM  ? ?Julia Dominguez ?27-May-1951 ?[id number] ? ?CC: Left renal stone and hydronephrosis ? ?HPI: ?I saw [name]'s today for a large left renal stone with hydronephrosis.  She has had left-sided flank pain as well as weight loss and underwent a CT chest abdomen and pelvis on 08/29/2021 which showed a 1.3 cm large left renal pelvis stone with some upstream hydronephrosis.  She denies any fevers or chills.  She was told she also had a UTI and was given antibiotics, but she is unsure what this medication was, I do not see any urine culture results to review.  She denies a history of kidney stones. ? ?She also had a 1 cm lung mass identified on recent CT, and referral to oncology for further evaluation is pending. ? ?Urinalysis today contaminated with 6-10 squamous cells, greater than 50 WBCs, 6-10 RBCs, few bacteria, moderate leukocytes, nitrite negative.  Sent for culture. ? ?PMH: ?Past Medical History:  ?Diagnosis Date  ? Coronary artery disease   ? Hypertension   ? Kidney stone   ? Renal disorder   ? renal stent  ? ? ?Surgical History: ?Past Surgical History:  ?Procedure Laterality Date  ? BREAST BIOPSY Right   ? neg  ? BREAST CYST ASPIRATION Right   ? neg  ? CARDIAC CATHETERIZATION Right 01/03/2016  ? Procedure: Left Heart Cath and Coronary Angiography;  Surgeon: Corey Skains, MD;  Location: [facility];  Service: Cardiovascular;  Laterality: Right;  ? CARDIAC CATHETERIZATION N/A 01/03/2016  ? Procedure: Coronary Stent Intervention;  Surgeon: Yolonda Kida, MD;  Location: [facility];  Service: Cardiovascular;  Laterality: N/A;  ? CARDIAC SURGERY    ? cardiac stents placed 2007  ? ? ? ?Family History: ?Family History  ?Problem Relation Age of Onset  ? Breast cancer Paternal Grandmother   ? ? ?Social History:  reports that she has been smoking cigarettes. She has never used smokeless tobacco. She reports that she does not currently use alcohol. She reports that she  does not use drugs. ? ?Physical Exam: ?BP (!) 199/80 (BP Location: Left Arm, Patient Position: Sitting, Cuff Size: Normal)   Pulse 64   Ht 5' 1.54" (1.563 m)   Wt 82 lb 3.2 oz (37.3 kg)   BMI 15.26 kg/m?   ? ?Constitutional: Thin, frail-appearing ?Cardiovascular: No clubbing, cyanosis, or edema. ?Respiratory: Normal respiratory effort, no increased work of breathing. ?GI: Abdomen is soft, nontender, nondistended, no abdominal masses ? ? ?Pertinent Imaging: ?I have personally viewed and interpreted the CT showing a 1.3 cm extremely dense 1900hu, left renal stone with some hydronephrosis of the calyces.  Renal ultrasound from 2019 showed a lower pole stone but no hydronephrosis. ? ?Assessment & Plan:   ?71 year old female with left-sided flank pain, no fevers/chills, or evidence of sepsis/infection, and CT showing a 1.3 cm left renal pelvis stone with dilation of upstream calyces. ? ?We discussed various treatment options for urolithiasis including observation with or without medical expulsive therapy, shockwave lithotripsy (SWL), ureteroscopy and laser lithotripsy with stent placement, and percutaneous nephrolithotomy. ? ?We discussed that management is based on stone size, location, density, patient co-morbidities, and patient preference.  ? ?Stones <63mm in size have a >80% spontaneous passage rate. Data surrounding the use of tamsulosin for medical expulsive therapy is controversial, but meta analyses suggests it is most efficacious for distal stones between 5-82mm in size. Possible side effects include dizziness/lightheadedness, and retrograde ejaculation. ? ?SWL has a lower  stone free rate in a single procedure, but also a lower complication rate compared to ureteroscopy and avoids a stent and associated stent related symptoms. Possible complications include renal hematoma, steinstrasse, and need for additional treatment. ? ?Ureteroscopy with laser lithotripsy and stent placement has a higher stone free rate  than SWL in a single procedure, however increased complication rate including possible infection, ureteral injury, bleeding, and stent related morbidity. Common stent related symptoms include dysuria, urgency/frequency, and flank pain. ? ?PCNL is the favored treatment for stones >2cm. It involves a small incision in the flank, with complete fragmentation of stones and removal. It has the highest stone free rate, but also the highest complication rate. Possible complications include bleeding, infection/sepsis, injury to surrounding organs including the pleura, and collecting system injury.  ? ?-After an extensive discussion of the risks and benefits of the above treatment options, the patient would like to proceed with left ureteroscopy, laser lithotripsy, stent placement.  We will start Bactrim DS twice daily and follow-up culture results. ?-I stressed the importance of close follow-up with oncology for her left lung mass ? ?Nickolas Madrid, MD ?08/31/2021 ? ?[facility] ?[address] ?[place], [place] [address] ?[phone] ? ?

## 2021-08-31 NOTE — Progress Notes (Signed)
Surgical Physician Order Form [facility] ? ?* Scheduling expectation : Next week ? ?*Length of Case: 1.5 hours ? ?*Clearance needed: no ? ?*Anticoagulation Instructions: May continue all anticoagulants ? ?*Aspirin Instructions: Ok to continue Aspirin ? ?*Post-op visit Date/Instructions: 2-week stent removal ? ?*Diagnosis: Left Nephrolithiasis ? ?*Procedure: left Ureteroscopy w/laser lithotripsy & stent placement (00459) ? ? ?Additional orders: N/A ? ?-Admit type: OUTpatient ? ?-Anesthesia: General ? ?-VTE Prophylaxis Standing Order SCD?s    ?   ?Other:  ? ?-Standing Lab Orders Per Anesthesia   ? ?Lab other: UA&Urine Culture (sent 4/5) ? ?-Standing Test orders EKG/Chest x-ray per Anesthesia      ? ?Test other:  ? ?- Medications:  Cipro 400mg  IV ? ?-Other orders:  N/A ? ? ? ?  ? ?

## 2021-08-31 NOTE — Telephone Encounter (Signed)
Called pt informed her of the information below. RX sent in. Pt voiced understanding.  ?

## 2021-08-31 NOTE — Telephone Encounter (Signed)
-----   Message from Billey Co, MD sent at 08/31/2021  1:03 PM EDT ----- ?Please start Bactrim DS twice daily x5 days, we will call with final culture results if antibiotics need to be changed ? ?Nickolas Madrid, MD ?08/31/2021 ? ? ?

## 2021-08-31 NOTE — H&P (View-Only) (Signed)
? ?08/31/21 ?12:35 PM  ? ?Julia Dominguez ?Oct 10, 1950 ?[id number] ? ?CC: Left renal stone and hydronephrosis ? ?HPI: ?I saw Julia Dominguez's today for a large left renal stone with hydronephrosis.  She has had left-sided flank pain as well as weight loss and underwent a CT chest abdomen and pelvis on 08/29/2021 which showed a 1.3 cm large left renal pelvis stone with some upstream hydronephrosis.  She denies any fevers or chills.  She was told she also had a UTI and was given antibiotics, but she is unsure what this medication was, I do not see any urine culture results to review.  She denies a history of kidney stones. ? ?She also had a 1 cm lung mass identified on recent CT, and referral to oncology for further evaluation is pending. ? ?Urinalysis today contaminated with 6-10 squamous cells, greater than 50 WBCs, 6-10 RBCs, few bacteria, moderate leukocytes, nitrite negative.  Sent for culture. ? ?PMH: ?Past Medical History:  ?Diagnosis Date  ? Coronary artery disease   ? Hypertension   ? Kidney stone   ? Renal disorder   ? renal stent  ? ? ?Surgical History: ?Past Surgical History:  ?Procedure Laterality Date  ? BREAST BIOPSY Right   ? neg  ? BREAST CYST ASPIRATION Right   ? neg  ? CARDIAC CATHETERIZATION Right 01/03/2016  ? Procedure: Left Heart Cath and Coronary Angiography;  Surgeon: Corey Skains, MD;  Location: [facility];  Service: Cardiovascular;  Laterality: Right;  ? CARDIAC CATHETERIZATION N/A 01/03/2016  ? Procedure: Coronary Stent Intervention;  Surgeon: Yolonda Kida, MD;  Location: [facility];  Service: Cardiovascular;  Laterality: N/A;  ? CARDIAC SURGERY    ? cardiac stents placed 2007  ? ? ? ?Family History: ?Family History  ?Problem Relation Age of Onset  ? Breast cancer Paternal Grandmother   ? ? ?Social History:  reports that she has been smoking cigarettes. She has never used smokeless tobacco. She reports that she does not currently use alcohol. She reports that she  does not use drugs. ? ?Physical Exam: ?BP (!) 199/80 (BP Location: Left Arm, Patient Position: Sitting, Cuff Size: Normal)   Pulse 64   Ht 5' 1.54" (1.563 m)   Wt 82 lb 3.2 oz (37.3 kg)   BMI 15.26 kg/m?   ? ?Constitutional: Thin, frail-appearing ?Cardiovascular: No clubbing, cyanosis, or edema. ?Respiratory: Normal respiratory effort, no increased work of breathing. ?GI: Abdomen is soft, nontender, nondistended, no abdominal masses ? ? ?Pertinent Imaging: ?I have personally viewed and interpreted the CT showing a 1.3 cm extremely dense 1900hu, left renal stone with some hydronephrosis of the calyces.  Renal ultrasound from 2019 showed a lower pole stone but no hydronephrosis. ? ?Assessment & Plan:   ?71 year old female with left-sided flank pain, no fevers/chills, or evidence of sepsis/infection, and CT showing a 1.3 cm left renal pelvis stone with dilation of upstream calyces. ? ?We discussed various treatment options for urolithiasis including observation with or without medical expulsive therapy, shockwave lithotripsy (SWL), ureteroscopy and laser lithotripsy with stent placement, and percutaneous nephrolithotomy. ? ?We discussed that management is based on stone size, location, density, patient co-morbidities, and patient preference.  ? ?Stones <87mm in size have a >80% spontaneous passage rate. Data surrounding the use of tamsulosin for medical expulsive therapy is controversial, but meta analyses suggests it is most efficacious for distal stones between 5-41mm in size. Possible side effects include dizziness/lightheadedness, and retrograde ejaculation. ? ?SWL has a lower  stone free rate in a single procedure, but also a lower complication rate compared to ureteroscopy and avoids a stent and associated stent related symptoms. Possible complications include renal hematoma, steinstrasse, and need for additional treatment. ? ?Ureteroscopy with laser lithotripsy and stent placement has a higher stone free rate  than SWL in a single procedure, however increased complication rate including possible infection, ureteral injury, bleeding, and stent related morbidity. Common stent related symptoms include dysuria, urgency/frequency, and flank pain. ? ?PCNL is the favored treatment for stones >2cm. It involves a small incision in the flank, with complete fragmentation of stones and removal. It has the highest stone free rate, but also the highest complication rate. Possible complications include bleeding, infection/sepsis, injury to surrounding organs including the pleura, and collecting system injury.  ? ?-After an extensive discussion of the risks and benefits of the above treatment options, the patient would like to proceed with left ureteroscopy, laser lithotripsy, stent placement.  We will start Bactrim DS twice daily and follow-up culture results. ?-I stressed the importance of close follow-up with oncology for her left lung mass ? ?Nickolas Madrid, MD ?08/31/2021 ? ?[facility] ?[address], [address] ?[place], [place] [address] ?[phone] ? ?

## 2021-08-31 NOTE — Progress Notes (Signed)
ne

## 2021-08-31 NOTE — Patient Instructions (Signed)
Laser Therapy for Kidney Stones ?Laser therapy for kidney stones is a procedure to break up small, hard mineral deposits that form in the kidney (kidney stones). The procedure is done using a device that produces a focused beam of light (laser). The laser breaks up kidney stones into pieces that are small enough to be passed out of the body through urination or removed from the body during the procedure. You may need laser therapy if you have kidney stones that are painful or block your urinary tract. ?This procedure is done by inserting a tube (ureteroscope) into your kidney through the urethral opening. The urethra is the part of the body that drains urine from the bladder. In women, the urethra opens above the vaginal opening. In men, the urethra opens at the tip of the penis. The ureteroscope is inserted through the urethra, and surgical instruments are moved through the bladder and the muscular tube that connects the kidney to the bladder (ureter) until they reach the kidney. ?Tell a health care provider about: ?Any allergies you have. ?All medicines you are taking, including vitamins, herbs, eye drops, creams, and over-the-counter medicines. ?Any problems you or family members have had with anesthetic medicines. ?Any blood disorders you have. ?Any surgeries you have had. ?Any medical conditions you have. ?Whether you are pregnant or may be pregnant. ?What are the risks? ?Generally, this is a safe procedure. However, problems may occur, including: ?Infection. ?Bleeding. ?Allergic reactions to medicines. ?Damage to the urethra, bladder, or ureter. ?Urinary tract infection (UTI). ?Narrowing of the urethra (urethral stricture). ?Difficulty passing urine. ?Blockage of the kidney caused by a fragment of kidney stone. ?What happens before the procedure? ?Medicines ?Ask your health care provider about: ?Changing or stopping your regular medicines. This is especially important if you are taking diabetes medicines or  blood thinners. ?Taking medicines such as aspirin and ibuprofen. These medicines can thin your blood. Do not take these medicines unless your health care provider tells you to take them. ?Taking over-the-counter medicines, vitamins, herbs, and supplements. ?Eating and drinking ?Follow instructions from your health care provider about eating and drinking, which may include: ?8 hours before the procedure - stop eating heavy meals or foods, such as meat, fried foods, or fatty foods. ?6 hours before the procedure - stop eating light meals or foods, such as toast or cereal. ?6 hours before the procedure - stop drinking milk or drinks that contain milk. ?2 hours before the procedure - stop drinking clear liquids. ?Staying hydrated ?Follow instructions from your health care provider about hydration, which may include: ?Up to 2 hours before the procedure - you may continue to drink clear liquids, such as water, clear fruit juice, black coffee, and plain tea. ? ?General instructions ?You may have a physical exam before the procedure. You may also have tests, such as imaging tests and blood or urine tests. ?If your ureter is too narrow, your health care provider may place a soft, flexible tube (stent) inside of it. The stent may be placed days or weeks before your laser therapy procedure. ?Plan to have someone take you home from the hospital or clinic. ?If you will be going home right after the procedure, plan to have someone stay with you for 24 hours. ?Do not use any products that contain nicotine or tobacco for at least 4 weeks before the procedure. These products include cigarettes, e-cigarettes, and chewing tobacco. If you need help quitting, ask your health care provider. ?Ask your health care provider: ?How your  surgical site will be marked or identified. ?What steps will be taken to help prevent infection. These may include: ?Removing hair at the surgery site. ?Washing skin with a germ-killing soap. ?Taking antibiotic  medicine. ?What happens during the procedure? ? ?An IV will be inserted into one of your veins. ?You will be given one or more of the following: ?A medicine to help you relax (sedative). ?A medicine to numb the area (local anesthetic). ?A medicine to make you fall asleep (general anesthetic). ?A ureteroscope will be inserted into your urethra. The ureteroscope will send images to a video screen in the operating room to guide your surgeon to the area of your kidney that will be treated. ?A small, flexible tube will be threaded through the ureteroscope and into your bladder and ureter, up to your kidney. ?The laser device will be inserted into your kidney through the tube. Your surgeon will pulse the laser on and off to break up kidney stones. ?A surgical instrument that has a tiny wire basket may be inserted through the tube into your kidney to remove the pieces of broken kidney stone. ?The procedure may vary among health care providers and hospitals. ?What happens after the procedure? ?Your blood pressure, heart rate, breathing rate, and blood oxygen level will be monitored until you leave the hospital or clinic. ?You will be given pain medicine as needed. ?You may continue to receive antibiotics. ?You may have a stent temporarily placed in your ureter. ?Do not drive for 24 hours if you were given a sedative during your procedure. ?You may be given a strainer to collect any stone fragments that you pass in your urine. Your health care provider may have these tested. ?Summary ?Laser therapy for kidney stones is a procedure to break up kidney stones into pieces that are small enough to be passed out of the body through urination or removed during the procedure. ?Follow instructions from your health care provider about eating and drinking before the procedure. ?During the procedure, the ureteroscope will send images to a video screen to guide your surgeon to the area of your kidney that will be treated. ?Do not drive  for 24 hours if you were given a sedative during your procedure. ?This information is not intended to replace advice given to you by your health care provider. Make sure you discuss any questions you have with your health care provider. ?Document Revised: 01/17/2021 Document Reviewed: 01/17/2021 ?Elsevier Patient Education ? [place] Therapy for Kidney Stones, Care After ?This sheet gives you information about how to care for yourself after your procedure. Your health care provider may also give you more specific instructions. If you have problems or questions, contact your health care provider. ?What can I expect after the procedure? ?After the procedure, it is common to have: ?Pain. ?A burning sensation while urinating. ?Small amounts of blood in your urine. ?A need to urinate frequently. ?Pieces of kidney stone in your urine. ?Mild discomfort when urinating that may be felt in the back. You may experience this if you have a flexible tube (stent) in your ureter. ?Follow these instructions at home: ?Medicines ?Take over-the-counter and prescription medicines only as told by your health care provider. ?If you were prescribed an antibiotic medicine, take it as told by your health care provider. Do not stop taking the antibiotic even if you start to feel better. ?Ask your health care provider if the medicine prescribed to you: ?Requires you to avoid driving or using heavy  machinery. ?Can cause constipation. You may need to take actions to prevent or treat constipation, such as: ?Take over-the-counter or prescription medicines. ?Eat foods that are high in fiber, such as beans, whole grains, and fresh fruits and vegetables. ?Limit foods that are high in fat and processed sugars, such as fried or sweet foods. ?Activity ?Return to your normal activities as told by your health care provider. Ask your health care provider what activities are safe for you. ?Do not drive for 24 hours if you were given a  sedative during your procedure. ?General instructions ?If your health care provider approves, you may take a warm bath to ease discomfort and burning. ?Drink enough fluid to keep your urine pale yellow. Hope Budds

## 2021-09-01 ENCOUNTER — Telehealth: Payer: Self-pay

## 2021-09-01 NOTE — Progress Notes (Signed)
[facility] Posting Form  ? ?Surgery Date/Time: Date: 09/09/2021 ? ?Surgeon: Dr. Nickolas Madrid, MD ? ?Surgery Location: Day Surgery ? ?Inpt ( No  )   Outpt (Yes)   Obs ( No  )  ? ?Diagnosis: N20.0 Left Nephrolithiasis ? ?-CPT: 17510 ? ?Surgery: Left Ureteroscopy with laser lithotripsy and stent placement ? ?Stop Anticoagulations: No, may continue ASA ? ?Cardiac/Medical/Pulmonary Clearance needed: no ? ?*Orders entered into EPIC  Date: 09/01/21  ? ?*Case booked in [place]  Date: 09/01/21 ? ?*Notified pt of Surgery: Date: 09/01/21 ? ?PRE-OP UA & CX: Yes, obtained on 08/31/2021 ? ?*Placed into Prior Authorization Work Fabio Bering Date: 09/01/21 ? ? ?Assistant/laser/rep:No ? ? ? ? ? ? ? ? ? ? ? ? ? ? ? ?

## 2021-09-01 NOTE — Telephone Encounter (Signed)
I spoke with Julia Dominguez. We have discussed possible surgery dates and Friday April 14th, 2023 was agreed upon by all parties. Patient given information about surgery date, what to expect pre-operatively and post operatively.  ?We discussed that a Pre-Admission Testing office will be calling to set up the pre-op visit that will take place prior to surgery, and that these appointments are typically done over the phone with a Pre-Admissions RN. ? Informed patient that our office will communicate any additional care to be provided after surgery. Patients questions or concerns were discussed during our call. Advised to call our office should there be any additional information, questions or concerns that arise. Patient verbalized understanding.  ? ?

## 2021-09-02 LAB — URINE CULTURE: Culture: <10000 — AB

## 2021-09-06 ENCOUNTER — Other Ambulatory Visit
Admission: RE | Admit: 2021-09-06 | Discharge: 2021-09-06 | Disposition: A | Discharge status: Home / Self Care | Payer: Medicare PPO | Source: Ambulatory Visit | Attending: Urology | Admitting: Urology

## 2021-09-06 HISTORY — DX: Personal history of urinary calculi: Z87.442

## 2021-09-06 HISTORY — DX: Chronic obstructive pulmonary disease, unspecified: J44.9

## 2021-09-06 NOTE — Patient Instructions (Signed)
?Your procedure is scheduled on: Friday September 09, 2021. ?Report to Day Surgery inside [facility] 2nd floor, stop by admissions desk before getting on elevator.  ?To find out your arrival time please call [phone] between 1PM - 3PM on Thursday September 08, 2021. ? ?Remember: Instructions that are not followed completely may result in serious medical risk,  ?up to and including death, or upon the discretion of your surgeon and anesthesiologist your  ?surgery may need to be rescheduled.  ? ?  _X__ 1. Do not eat food or drink fluids after midnight the night before your procedure. ?                No chewing gum or hard candies. ? ?__X__2.  On the morning of surgery brush your teeth with toothpaste and water, you ?               may rinse your mouth with mouthwash if you wish.  Do not swallow any toothpaste or mouthwash. ?   ? _X__ 3.  No Alcohol for 24 hours before or after surgery. ? ? _X__ 4.  Do Not Smoke or use e-cigarettes For 24 Hours Prior to Your Surgery. ?                Do not use any chewable tobacco products for at least 6 hours prior to ?                Surgery. ? ?_X__  5.  Do not use any recreational drugs (marijuana, cocaine, heroin, ecstasy, MDMA or other) ?               For at least one week prior to your surgery.  Combination of these drugs with anesthesia ?               May have life threatening results. ? ?____  6.  Bring all medications with you on the day of surgery if instructed.  ? ?__X__  7.  Notify your doctor if there is any change in your medical condition  ?    (cold, fever, infections). ?    ?Do not wear jewelry, make-up, hairpins, clips or nail polish. ?Do not wear lotions, powders, or perfumes. You may wear deodorant. ?Do not shave 48 hours prior to surgery.  ?Do not bring valuables to the hospital.   ? ?[facility] is not responsible for any belongings or valuables. ? ?Contacts, dentures or bridgework may not be worn into surgery. ?Leave your suitcase in the  car. After surgery it may be brought to your room. ?For patients admitted to the hospital, discharge time is determined by your ?treatment team. ?  ?Patients discharged the day of surgery will not be allowed to drive home.   ?Make arrangements for someone to be with you for the first 24 hours of your ?Same Day Discharge. ? ? ?__X__ Take these medicines the morning of surgery with A SIP OF WATER:  ? ? 1. metoprolol tartrate (LOPRESSOR) 25 MG ? 2.  ? 3.  ? 4. ? 5. ? 6. ? ?____ Fleet Enema (as directed)  ? ?____ Use CHG Soap (or wipes) as directed ? ?____ Use Benzoyl Peroxide Gel as instructed ? ?____ Use inhalers on the day of surgery ? ?____ Stop metformin 2 days prior to surgery   ? ?____ Take 1/2 of usual insulin dose the night before surgery. No insulin the morning ?  of surgery.  ? ?____ Call your PCP, cardiologist, or Pulmonologist if taking Coumadin/Plavix/aspirin and ask when to stop before your surgery.  ? ?__X__ One Week prior to surgery- Stop Anti-inflammatories such as Ibuprofen, Aleve, Advil, Motrin, meloxicam (MOBIC), diclofenac, etodolac, ketorolac, Toradol, Daypro, piroxicam, Goody's or BC powders. OK TO USE TYLENOL IF NEEDED ?  ?__X__ Stop supplements until after surgery.   ? ?____ Bring C-Pap to the hospital.  ? ? ?If you have any questions regarding your pre-procedure instructions,  ?Please call Pre-admit Testing at [phone] ?

## 2021-09-07 ENCOUNTER — Other Ambulatory Visit
Admission: RE | Admit: 2021-09-07 | Discharge: 2021-09-07 | Disposition: A | Discharge status: Home / Self Care | Payer: Medicare PPO | Source: Ambulatory Visit | Attending: Urology | Admitting: Urology

## 2021-09-07 DIAGNOSIS — I10 Essential (primary) hypertension: Secondary | ICD-10-CM | POA: Insufficient documentation

## 2021-09-07 DIAGNOSIS — Z01812 Encounter for preprocedural laboratory examination: Secondary | ICD-10-CM | POA: Diagnosis not present

## 2021-09-07 DIAGNOSIS — Z01810 Encounter for preprocedural cardiovascular examination: Secondary | ICD-10-CM | POA: Diagnosis not present

## 2021-09-08 ENCOUNTER — Other Ambulatory Visit (HOSPITAL_COMMUNITY): Payer: Self-pay | Admitting: Infectious Diseases

## 2021-09-08 DIAGNOSIS — R911 Solitary pulmonary nodule: Secondary | ICD-10-CM

## 2021-09-08 DIAGNOSIS — N132 Hydronephrosis with renal and ureteral calculous obstruction: Secondary | ICD-10-CM

## 2021-09-08 MED ORDER — ORAL CARE MOUTH RINSE: 15.0000 mL | Freq: Once | OROMUCOSAL | Status: AC

## 2021-09-08 MED ORDER — CIPROFLOXACIN IN D5W 400 MG/200ML IV SOLN
400.0000 mg | INTRAVENOUS | Status: AC
  Administered 2021-09-09: 400 mg via INTRAVENOUS

## 2021-09-08 MED ORDER — LACTATED RINGERS IV SOLN: INTRAVENOUS | Status: DC

## 2021-09-08 MED ORDER — FAMOTIDINE 20 MG PO TABS: 20.0000 mg | ORAL_TABLET | Freq: Once | ORAL | Status: AC

## 2021-09-08 MED ORDER — CHLORHEXIDINE GLUCONATE 0.12 % MT SOLN: 15.0000 mL | Freq: Once | OROMUCOSAL | Status: AC

## 2021-09-09 ENCOUNTER — Encounter
Admission: RE | Disposition: A | Discharge status: Home / Self Care | Payer: Self-pay | Source: Home / Self Care | Attending: Urology

## 2021-09-09 ENCOUNTER — Ambulatory Visit: Payer: Medicare PPO

## 2021-09-09 ENCOUNTER — Ambulatory Visit
Admission: RE | Admit: 2021-09-09 | Discharge: 2021-09-09 | Disposition: A | Discharge status: Home / Self Care | Payer: Medicare PPO | Attending: Urology | Admitting: Urology

## 2021-09-09 ENCOUNTER — Ambulatory Visit: Payer: Medicare PPO | Admitting: Certified Registered Nurse Anesthetist

## 2021-09-09 ENCOUNTER — Other Ambulatory Visit: Payer: Self-pay

## 2021-09-09 ENCOUNTER — Encounter: Payer: Self-pay | Admitting: Urology

## 2021-09-09 DIAGNOSIS — R918 Other nonspecific abnormal finding of lung field: Secondary | ICD-10-CM | POA: Insufficient documentation

## 2021-09-09 DIAGNOSIS — I2510 Atherosclerotic heart disease of native coronary artery without angina pectoris: Secondary | ICD-10-CM | POA: Diagnosis not present

## 2021-09-09 DIAGNOSIS — J449 Chronic obstructive pulmonary disease, unspecified: Secondary | ICD-10-CM | POA: Insufficient documentation

## 2021-09-09 DIAGNOSIS — N200 Calculus of kidney: Secondary | ICD-10-CM

## 2021-09-09 DIAGNOSIS — F17200 Nicotine dependence, unspecified, uncomplicated: Secondary | ICD-10-CM | POA: Diagnosis not present

## 2021-09-09 DIAGNOSIS — N201 Calculus of ureter: Secondary | ICD-10-CM | POA: Diagnosis not present

## 2021-09-09 DIAGNOSIS — Z87442 Personal history of urinary calculi: Secondary | ICD-10-CM | POA: Diagnosis not present

## 2021-09-09 DIAGNOSIS — N132 Hydronephrosis with renal and ureteral calculous obstruction: Secondary | ICD-10-CM | POA: Insufficient documentation

## 2021-09-09 DIAGNOSIS — F17210 Nicotine dependence, cigarettes, uncomplicated: Secondary | ICD-10-CM | POA: Diagnosis not present

## 2021-09-09 DIAGNOSIS — I252 Old myocardial infarction: Secondary | ICD-10-CM | POA: Insufficient documentation

## 2021-09-09 DIAGNOSIS — I10 Essential (primary) hypertension: Secondary | ICD-10-CM | POA: Insufficient documentation

## 2021-09-09 HISTORY — PX: CYSTOSCOPY/URETEROSCOPY/HOLMIUM LASER/STENT PLACEMENT: SHX6546

## 2021-09-09 SURGERY — CYSTOSCOPY/URETEROSCOPY/HOLMIUM LASER/STENT PLACEMENT
Anesthesia: General | Site: Ureter | Laterality: Left

## 2021-09-09 MED ORDER — FENTANYL CITRATE (PF) 100 MCG/2ML IJ SOLN: 25.0000 ug | INTRAMUSCULAR | Status: DC | PRN

## 2021-09-09 MED ORDER — CIPROFLOXACIN IN D5W 400 MG/200ML IV SOLN
INTRAVENOUS | Status: AC
Start: 2021-09-09 — End: 2021-09-09
  Filled 2021-09-09: qty 200

## 2021-09-09 MED ORDER — ONDANSETRON HCL 4 MG/2ML IJ SOLN: 4.0000 mg | Freq: Once | INTRAMUSCULAR | Status: DC | PRN

## 2021-09-09 MED ORDER — OXYBUTYNIN CHLORIDE ER 10 MG PO TB24: 10.0000 mg | ORAL_TABLET | Freq: Every day | ORAL | 0 refills | Status: AC | PRN

## 2021-09-09 MED ORDER — OXYCODONE HCL 5 MG PO TABS: 5.0000 mg | ORAL_TABLET | Freq: Four times a day (QID) | ORAL | 0 refills | Status: DC | PRN

## 2021-09-09 MED ORDER — ROCURONIUM BROMIDE 100 MG/10ML IV SOLN
INTRAVENOUS | Status: DC | PRN
  Administered 2021-09-09: 40 mg via INTRAVENOUS

## 2021-09-09 MED ORDER — SUGAMMADEX SODIUM 200 MG/2ML IV SOLN
INTRAVENOUS | Status: DC | PRN
Start: 2021-09-09 — End: 2021-09-09
  Administered 2021-09-09: 150 mg via INTRAVENOUS

## 2021-09-09 MED ORDER — EPHEDRINE SULFATE (PRESSORS) 50 MG/ML IJ SOLN
INTRAMUSCULAR | Status: DC | PRN
  Administered 2021-09-09: 10 mg via INTRAVENOUS

## 2021-09-09 MED ORDER — ACETAMINOPHEN 10 MG/ML IV SOLN
INTRAVENOUS | Status: AC
  Filled 2021-09-09: qty 100

## 2021-09-09 MED ORDER — MIDAZOLAM HCL 2 MG/2ML IJ SOLN
INTRAMUSCULAR | Status: AC
Start: 2021-09-09 — End: ?
  Filled 2021-09-09: qty 2

## 2021-09-09 MED ORDER — KETOROLAC TROMETHAMINE 30 MG/ML IJ SOLN
INTRAMUSCULAR | Status: DC | PRN
  Administered 2021-09-09: 15 mg via INTRAVENOUS

## 2021-09-09 MED ORDER — SODIUM CHLORIDE 0.9 % IR SOLN
Status: DC | PRN
  Administered 2021-09-09: 1000 mL

## 2021-09-09 MED ORDER — FENTANYL CITRATE (PF) 100 MCG/2ML IJ SOLN
INTRAMUSCULAR | Status: AC
Start: 2021-09-09 — End: ?
  Filled 2021-09-09: qty 2

## 2021-09-09 MED ORDER — CHLORHEXIDINE GLUCONATE 0.12 % MT SOLN
OROMUCOSAL | Status: AC
  Administered 2021-09-09: 15 mL via OROMUCOSAL
  Filled 2021-09-09: qty 15

## 2021-09-09 MED ORDER — LIDOCAINE HCL (CARDIAC) PF 100 MG/5ML IV SOSY
PREFILLED_SYRINGE | INTRAVENOUS | Status: DC | PRN
  Administered 2021-09-09: 40 mg via INTRAVENOUS

## 2021-09-09 MED ORDER — GLYCOPYRROLATE 0.2 MG/ML IJ SOLN
INTRAMUSCULAR | Status: DC | PRN
  Administered 2021-09-09: .2 mg via INTRAVENOUS

## 2021-09-09 MED ORDER — PROPOFOL 10 MG/ML IV BOLUS
INTRAVENOUS | Status: AC
  Filled 2021-09-09: qty 20

## 2021-09-09 MED ORDER — ONDANSETRON HCL 4 MG/2ML IJ SOLN
INTRAMUSCULAR | Status: DC | PRN
  Administered 2021-09-09: 4 mg via INTRAVENOUS

## 2021-09-09 MED ORDER — ACETAMINOPHEN 10 MG/ML IV SOLN
INTRAVENOUS | Status: DC | PRN
  Administered 2021-09-09: 500 mg via INTRAVENOUS

## 2021-09-09 MED ORDER — PROPOFOL 10 MG/ML IV BOLUS
INTRAVENOUS | Status: DC | PRN
  Administered 2021-09-09: 100 mg via INTRAVENOUS

## 2021-09-09 MED ORDER — FAMOTIDINE 20 MG PO TABS
ORAL_TABLET | ORAL | Status: AC
  Administered 2021-09-09: 20 mg via ORAL
  Filled 2021-09-09: qty 1

## 2021-09-09 MED ORDER — DEXAMETHASONE SODIUM PHOSPHATE 10 MG/ML IJ SOLN
INTRAMUSCULAR | Status: DC | PRN
  Administered 2021-09-09: 10 mg via INTRAVENOUS

## 2021-09-09 MED ORDER — FENTANYL CITRATE (PF) 100 MCG/2ML IJ SOLN
INTRAMUSCULAR | Status: DC | PRN
  Administered 2021-09-09 (×2): 25 ug via INTRAVENOUS
  Administered 2021-09-09: 50 ug via INTRAVENOUS

## 2021-09-09 MED ORDER — IOHEXOL 180 MG/ML  SOLN
INTRAMUSCULAR | Status: DC | PRN
  Administered 2021-09-09: 4 mL

## 2021-09-09 MED ORDER — PHENYLEPHRINE 40 MCG/ML (10ML) SYRINGE FOR IV PUSH (FOR BLOOD PRESSURE SUPPORT)
PREFILLED_SYRINGE | INTRAVENOUS | Status: DC | PRN
  Administered 2021-09-09 (×7): 80 ug via INTRAVENOUS

## 2021-09-09 SURGICAL SUPPLY — 32 items
ADH LQ OCL WTPRF AMP STRL LF (MISCELLANEOUS)
ADHESIVE MASTISOL STRL (MISCELLANEOUS) IMPLANT
BAG DRAIN CYSTO-URO LG1000N (MISCELLANEOUS) ×3 IMPLANT
BRUSH SCRUB EZ 1% IODOPHOR (MISCELLANEOUS) ×3 IMPLANT
CATH URET FLEX-TIP 2 LUMEN 10F (CATHETERS) ×1 IMPLANT
CATH URETL OPEN 5X70 (CATHETERS) ×1 IMPLANT
CNTNR SPEC 2.5X3XGRAD LEK (MISCELLANEOUS)
CONT SPEC 4OZ STER OR WHT (MISCELLANEOUS)
CONT SPEC 4OZ STRL OR WHT (MISCELLANEOUS)
CONTAINER SPEC 2.5X3XGRAD LEK (MISCELLANEOUS) IMPLANT
DRAPE UTILITY 15X26 TOWEL STRL (DRAPES) ×3 IMPLANT
DRSG TEGADERM 2-3/8X2-3/4 SM (GAUZE/BANDAGES/DRESSINGS) IMPLANT
FIBER LASER MOSES 200 DFL (Laser) ×1 IMPLANT
GLOVE SURG UNDER POLY LF SZ7.5 (GLOVE) ×3 IMPLANT
GOWN STRL REUS W/ TWL LRG LVL3 (GOWN DISPOSABLE) ×2 IMPLANT
GOWN STRL REUS W/ TWL XL LVL3 (GOWN DISPOSABLE) ×2 IMPLANT
GOWN STRL REUS W/TWL LRG LVL3 (GOWN DISPOSABLE) ×2
GOWN STRL REUS W/TWL XL LVL3 (GOWN DISPOSABLE) ×2
GUIDEWIRE STR DUAL SENSOR (WIRE) ×4 IMPLANT
INFUSOR MANOMETER BAG 3000ML (MISCELLANEOUS) ×3 IMPLANT
IV NS IRRIG 3000ML ARTHROMATIC (IV SOLUTION) ×3 IMPLANT
KIT TURNOVER CYSTO (KITS) ×3 IMPLANT
PACK CYSTO AR (MISCELLANEOUS) ×3 IMPLANT
SET CYSTO W/LG BORE CLAMP LF (SET/KITS/TRAYS/PACK) ×3 IMPLANT
SHEATH URETERAL 12FRX35CM (MISCELLANEOUS) ×1 IMPLANT
STENT URET 6FRX24 CONTOUR (STENTS) ×1 IMPLANT
STENT URET 6FRX26 CONTOUR (STENTS) IMPLANT
SURGILUBE 2OZ TUBE FLIPTOP (MISCELLANEOUS) ×3 IMPLANT
SYR 10ML LL (SYRINGE) ×3 IMPLANT
TRACTIP FLEXIVA PULSE ID 200 (Laser) IMPLANT
VALVE UROSEAL ADJ ENDO (VALVE) ×1 IMPLANT
WATER STERILE IRR 500ML POUR (IV SOLUTION) ×3 IMPLANT

## 2021-09-09 NOTE — Interval H&P Note (Signed)
UROLOGY H&P UPDATE ? ?Agree with prior H&P dated 08/31/2021. ? ?Cardiac: RRR ?Lungs: CTA bilaterally ? ?Laterality: Left ?Procedure: Left ureteroscopy, laser lithotripsy, stent placement ? ?Urine: Culture 4/5 <10k insignificant growth ? ?We specifically discussed the risks ureteroscopy including bleeding, infection/sepsis, stent related symptoms including flank pain/urgency/frequency/incontinence/dysuria, ureteral injury, inability to access stone, or need for staged or additional procedures. ? ? ? ?Billey Co, MD ?09/09/2021 ? ? ?

## 2021-09-09 NOTE — Anesthesia Preprocedure Evaluation (Signed)
Anesthesia Evaluation  ?Patient identified by MRN, date of birth, ID band ?Patient awake ? ? ? ?Reviewed: ?Allergy & Precautions, H&P , NPO status , Patient's Chart, lab work & pertinent test results, reviewed documented beta blocker date and time  ? ?Airway ?Mallampati: II ? ?TM Distance: >3 FB ?Neck ROM: full ? ? ? Dental ? ?(+) Teeth Intact ?  ?Pulmonary ?COPD,  COPD inhaler, Current Smoker and Patient abstained from smoking.,  ?  ?Pulmonary exam normal ? ? ? ? ? ? ? Cardiovascular ?Exercise Tolerance: Poor ?hypertension, On Medications ?+ CAD and + Past MI  ?Normal cardiovascular exam ?Rhythm:regular Rate:Normal ? ? ?  ?Neuro/Psych ?negative neurological ROS ? negative psych ROS  ? GI/Hepatic ?negative GI ROS, Neg liver ROS,   ?Endo/Other  ?negative endocrine ROS ? Renal/GU ?ARFRenal disease  ?negative genitourinary ?  ?Musculoskeletal ? ? Abdominal ?  ?Peds ? Hematology ?negative hematology ROS ?(+)   ?Anesthesia Other Findings ?Past Medical History: ?No date: COPD (chronic obstructive pulmonary disease) ([place]) ?No date: Coronary artery disease ?No date: History of kidney stones ?No date: Hypertension ?No date: Kidney stone ?No date: Renal disorder ?    Comment:  renal stent ?Past Surgical History: ?No date: BREAST BIOPSY; Right ?    Comment:  neg ?No date: BREAST CYST ASPIRATION; Right ?    Comment:  neg ?01/03/2016: CARDIAC CATHETERIZATION; Right ?    Comment:  Procedure: Left Heart Cath and Coronary Angiography;   ?             Surgeon: Corey Skains, MD;  Location: [facility]  ?             CV LAB;  Service: Cardiovascular;  Laterality: Right; ?01/03/2016: CARDIAC CATHETERIZATION; N/A ?    Comment:  Procedure: Coronary Stent Intervention;  Surgeon: Karma Greaser ?             Prince Rome, MD;  Location: [facility] CV LAB;   ?             Service: Cardiovascular;  Laterality: N/A; ?No date: CARDIAC SURGERY ?    Comment:  cardiac stents placed 2007 ?BMI   ? Body Mass Index: 15.24  kg/m?  ?  ? Reproductive/Obstetrics ?negative OB ROS ? ?  ? ? ? ? ? ? ? ? ? ? ? ? ? ?  ?  ? ? ? ? ? ? ? ? ?Anesthesia Physical ?Anesthesia Plan ? ?ASA: 3 ? ?Anesthesia Plan: General ETT  ? ?Post-op Pain Management:   ? ?Induction:  ? ?PONV Risk Score and Plan:  ? ?Airway Management Planned:  ? ?Additional Equipment:  ? ?Intra-op Plan:  ? ?Post-operative Plan:  ? ?Informed Consent: I have reviewed the patients History and Physical, chart, labs and discussed the procedure including the risks, benefits and alternatives for the proposed anesthesia with the patient or authorized representative who has indicated his/her understanding and acceptance.  ? ? ? ?Dental Advisory Given ? ?Plan Discussed with: CRNA ? ?Anesthesia Plan Comments:   ? ? ? ? ? ? ?Anesthesia Quick Evaluation ? ?

## 2021-09-09 NOTE — Op Note (Signed)
Date of procedure: 09/09/21 ? ?Preoperative diagnosis:  ?Left UPJ stone ? ?Postoperative diagnosis:  ?Same ? ?Procedure: ?Cystoscopy, left ureteroscopy, laser lithotripsy, left retrograde pyelogram with intraoperative interpretation, left ureteral stent placement ? ?Surgeon: Nickolas Madrid, MD ? ?Anesthesia: General ? ?Complications: None ? ?Intraoperative findings:  ?Normal cystoscopy, uncomplicated dusting of 1.2 cm left UPJ stone and stent placement ? ?EBL: Minimal ? ?Specimens: None ? ?Drains: Left 6 French by 24 cm ureteral stent ? ?Indication: Kailene Steinhart Veitch is a 71 y.o. patient with severe left-sided flank pain and CT showing a 1.2 cm left UPJ stone and she opted for ureteroscopy.  After reviewing the management options for treatment, they elected to proceed with the above surgical procedure(s). We have discussed the potential benefits and risks of the procedure, side effects of the proposed treatment, the likelihood of the patient achieving the goals of the procedure, and any potential problems that might occur during the procedure or recuperation. Informed consent has been obtained. ? ?Description of procedure: ? ?The patient was taken to the operating room and general anesthesia was induced. SCDs were placed for DVT prophylaxis. The patient was placed in the dorsal lithotomy position, prepped and draped in the usual sterile fashion, and preoperative antibiotics(Cipro) were administered. A preoperative time-out was performed.  ? ?A 21 French rigid cystoscope was used to intubate the urethra and thorough cystoscopy was performed.  The bladder was grossly normal and the ureteral orifices were orthotopic bilaterally.  A sensor wire was advanced into the left ureteral orifice and advanced easily up to the kidney under fluoroscopic vision.  The UPJ stone could be clearly seen on x-ray.  A dual-lumen ureteral access catheter was advanced easily over the wire up to the level of the stone, and a second  safety wire was added. ? ?A 12/14 [place] by 35 cm ureteral access sheath was gently advanced over the wire up to the level of the stone under fluoroscopic vision. ? ?Single channel digital flexible ureteroscope was then advanced through the sheath and I identified a crystalline yellow stone at the UPJ.  The Moses 200 ?m laser fiber on settings of 0.5 J and 80 Hz was used to methodically dust the stone to <55mm fragments.  The stone dusted very nicely.  At the conclusion of fragmentation, thorough pyeloscopy revealed no residual stone fragments. ? ?A retrograde pyelogram was performed in the proximal ureter and showed no extravasation or filling defects. ? ?The sheath was removed and careful pullback ureteroscopy showed no residual stones or ureteral injury. ? ?A 6 French by 24 cm ureteral stent was placed fluoroscopically with an excellent curl in the upper pole, as well as in the bladder.  The cystoscope sheath with obturator was used to drain the bladder. ? ?Disposition: Stable to PACU ? ?Plan: ?Stent removal in clinic in 10 to 14 days ? ?Nickolas Madrid, MD ? ?

## 2021-09-09 NOTE — Transfer of Care (Signed)
Immediate Anesthesia Transfer of Care Note ? ?Patient: Julia Dominguez ? ?Procedure(s) Performed: CYSTOSCOPY/URETEROSCOPY/HOLMIUM LASER/STENT PLACEMENT (Left: Ureter) ? ?Patient Location: PACU ? ?Anesthesia Type:General ? ?Level of Consciousness: drowsy ? ?Airway & Oxygen Therapy: Patient Spontanous Breathing and Patient connected to face mask oxygen ? ?Post-op Assessment: Report given to RN and Post -op Vital signs reviewed and stable ? ?Post vital signs: Reviewed and stable ? ?Last Vitals:  ?Vitals Value Taken Time  ?BP 149/69 09/09/21 1154  ?Temp    ?Pulse 60 09/09/21 1155  ?Resp 26 09/09/21 1155  ?SpO2 100 % 09/09/21 1155  ?Vitals shown include unvalidated device data. ? ?Last Pain:  ?Vitals:  ? 09/09/21 0726  ?TempSrc: Temporal  ?PainSc: 0-No pain  ?   ? ?  ? ?Complications: No notable events documented. ?

## 2021-09-09 NOTE — Anesthesia Postprocedure Evaluation (Signed)
Anesthesia Post Note ? ?Patient: Julia Dominguez ? ?Procedure(s) Performed: CYSTOSCOPY/URETEROSCOPY/HOLMIUM LASER/STENT PLACEMENT (Left: Ureter) ? ?Patient location during evaluation: PACU ?Anesthesia Type: General ?Level of consciousness: awake and alert ?Pain management: pain level controlled ?Vital Signs Assessment: post-procedure vital signs reviewed and stable ?Respiratory status: spontaneous breathing, nonlabored ventilation, respiratory function stable and patient connected to nasal cannula oxygen ?Cardiovascular status: blood pressure returned to baseline and stable ?Postop Assessment: no apparent nausea or vomiting ?Anesthetic complications: no ? ? ?No notable events documented. ? ? ?Last Vitals:  ?Vitals:  ? 09/09/21 0726 09/09/21 1155  ?BP: (!) 154/68 (!) 149/69  ?Pulse: (!) 49 62  ?Resp: 18 15  ?Temp: (!) 36.3 ?C (!) 36.2 ?C  ?SpO2: 99% 100%  ?  ?Last Pain:  ?Vitals:  ? 09/09/21 1155  ?TempSrc:   ?PainSc: Asleep  ? ? ?  ?  ?  ?  ?  ?  ? ?Molli Barrows ? ? ? ? ?

## 2021-09-09 NOTE — Discharge Instructions (Signed)

## 2021-09-09 NOTE — Anesthesia Procedure Notes (Signed)
Procedure Name: Intubation ?Date/Time: 09/09/2021 11:01 AM ?Performed by: Lily Peer, Dee Paden, CRNA ?Pre-anesthesia Checklist: Patient identified, Emergency Drugs available, Suction available and Patient being monitored ?Patient Re-evaluated:Patient Re-evaluated prior to induction ?Oxygen Delivery Method: Circle system utilized ?Preoxygenation: Pre-oxygenation with 100% oxygen ?Induction Type: IV induction ?Ventilation: Mask ventilation without difficulty ?Laryngoscope Size: McGraph and 3 ?Grade View: Grade I ?Tube type: Oral ?Tube size: 7.0 mm ?Number of attempts: 1 ?Airway Equipment and Method: Stylet ?Placement Confirmation: ETT inserted through vocal cords under direct vision, positive ETCO2 and breath sounds checked- equal and bilateral ?Secured at: 21 cm ?Tube secured with: Tape ?Dental Injury: Teeth and Oropharynx as per pre-operative assessment  ? ? ? ? ?

## 2021-09-10 ENCOUNTER — Encounter: Payer: Self-pay | Admitting: Urology

## 2021-09-13 DIAGNOSIS — Z87442 Personal history of urinary calculi: Secondary | ICD-10-CM | POA: Diagnosis not present

## 2021-09-13 DIAGNOSIS — Z0000 Encounter for general adult medical examination without abnormal findings: Secondary | ICD-10-CM | POA: Diagnosis not present

## 2021-09-13 DIAGNOSIS — E785 Hyperlipidemia, unspecified: Secondary | ICD-10-CM | POA: Diagnosis not present

## 2021-09-13 DIAGNOSIS — R634 Abnormal weight loss: Secondary | ICD-10-CM | POA: Diagnosis not present

## 2021-09-13 DIAGNOSIS — R911 Solitary pulmonary nodule: Secondary | ICD-10-CM | POA: Diagnosis not present

## 2021-09-13 DIAGNOSIS — Z1389 Encounter for screening for other disorder: Secondary | ICD-10-CM | POA: Diagnosis not present

## 2021-09-13 DIAGNOSIS — I2510 Atherosclerotic heart disease of native coronary artery without angina pectoris: Secondary | ICD-10-CM | POA: Diagnosis not present

## 2021-09-13 DIAGNOSIS — I10 Essential (primary) hypertension: Secondary | ICD-10-CM | POA: Diagnosis not present

## 2021-09-13 DIAGNOSIS — F17210 Nicotine dependence, cigarettes, uncomplicated: Secondary | ICD-10-CM | POA: Diagnosis not present

## 2021-09-15 DIAGNOSIS — R0602 Shortness of breath: Secondary | ICD-10-CM | POA: Diagnosis not present

## 2021-09-15 DIAGNOSIS — R911 Solitary pulmonary nodule: Secondary | ICD-10-CM | POA: Diagnosis not present

## 2021-09-21 ENCOUNTER — Ambulatory Visit
Admission: RE | Admit: 2021-09-21 | Discharge: 2021-09-21 | Disposition: A | Discharge status: Home / Self Care | Payer: Medicare PPO | Source: Ambulatory Visit | Attending: Radiation Oncology | Admitting: Radiation Oncology

## 2021-09-21 ENCOUNTER — Encounter: Payer: Self-pay | Admitting: Radiation Oncology

## 2021-09-21 ENCOUNTER — Encounter: Payer: Self-pay | Admitting: *Deleted

## 2021-09-21 VITALS — BP 165/85 | HR 63 | Temp 98.0°F | Resp 16 | Ht 61.5 in | Wt 82.2 lb

## 2021-09-21 DIAGNOSIS — R911 Solitary pulmonary nodule: Secondary | ICD-10-CM | POA: Diagnosis not present

## 2021-09-21 DIAGNOSIS — F17210 Nicotine dependence, cigarettes, uncomplicated: Secondary | ICD-10-CM | POA: Insufficient documentation

## 2021-09-21 DIAGNOSIS — J432 Centrilobular emphysema: Secondary | ICD-10-CM | POA: Insufficient documentation

## 2021-09-21 DIAGNOSIS — Z7982 Long term (current) use of aspirin: Secondary | ICD-10-CM | POA: Insufficient documentation

## 2021-09-21 DIAGNOSIS — Z79899 Other long term (current) drug therapy: Secondary | ICD-10-CM | POA: Diagnosis not present

## 2021-09-21 DIAGNOSIS — Z87442 Personal history of urinary calculi: Secondary | ICD-10-CM | POA: Insufficient documentation

## 2021-09-21 DIAGNOSIS — I2510 Atherosclerotic heart disease of native coronary artery without angina pectoris: Secondary | ICD-10-CM | POA: Diagnosis not present

## 2021-09-21 NOTE — Progress Notes (Signed)
Met with patient during initial consult with Dr. Baruch Gouty to discuss radiation treatment. All questions answered during visit. Informed pt that will work on getting PET scan rescheduled sooner and that she will be notified with that appt once changed. Contact info given and instructed to call with any questions or needs. Pt verbalized understanding.  ?

## 2021-09-21 NOTE — Consult Note (Signed)
?NEW PATIENT EVALUATION ? ?Name: Julia Dominguez  ?MRN: [id number]  ?Date:   09/21/2021     ?DOB: July 17, 1950 ? ? ?This 71 y.o. female patient presents to the clinic for initial evaluation of presumed stage I lung cancer of the left upper lobe. ? ?REFERRING PHYSICIAN: Ebbie Ridge, MD ? ?CHIEF COMPLAINT:  ?Chief Complaint  ?Patient presents with  ? Lung Cancer  ? ? ?DIAGNOSIS: The encounter diagnosis was Solitary pulmonary nodule. ?  ?PREVIOUS INVESTIGATIONS:  ?CT scan reviewed PET CT scan ordered ?Labs reviewed ?Clinical notes reviewed ? ?HPI: Patient is a 71 year old current smoker who on work-up for renal calculi was found to have a suspicious part solid left upper lobe pulmonary nodule proximal 1 cm.  She is also noted on CT scan to have a partial obstructing large calculus in the left renal pelvis.  She recently was stented by urology.  She was referred to pulmonary medicine at [facility] and PET CT scan has been ordered.  Patient has centrilobular emphysema and is not thought by pulmonology to be surgical candidate.  She is fairly asymptomatic from a pulmonary standpoint specifically [place] cough hemoptysis or chest tightness.  She is now referred to radiation oncology for consideration of SBRT treatment. ? ?PLANNED TREATMENT REGIMEN: SBRT ? ?PAST MEDICAL HISTORY:  has a past medical history of COPD (chronic obstructive pulmonary disease) ([place]), Coronary artery disease, History of kidney stones, Hypertension, Kidney stone, and Renal disorder.   ? ?PAST SURGICAL HISTORY:  ?Past Surgical History:  ?Procedure Laterality Date  ? BREAST BIOPSY Right   ? neg  ? BREAST CYST ASPIRATION Right   ? neg  ? CARDIAC CATHETERIZATION Right 01/03/2016  ? Procedure: Left Heart Cath and Coronary Angiography;  Surgeon: Corey Skains, MD;  Location: [place] [facility];  Service: Cardiovascular;  Laterality: Right;  ? CARDIAC CATHETERIZATION N/A 01/03/2016  ? Procedure: Coronary Stent Intervention;  Surgeon: Yolonda Kida,  MD;  Location: [place] [facility];  Service: Cardiovascular;  Laterality: N/A;  ? CARDIAC SURGERY    ? cardiac stents placed 2007  ? CYSTOSCOPY/URETEROSCOPY/HOLMIUM LASER/STENT PLACEMENT Left 09/09/2021  ? Procedure: CYSTOSCOPY/URETEROSCOPY/HOLMIUM LASER/STENT PLACEMENT;  Surgeon: Billey Co, MD;  Location: [facility];  Service: Urology;  Laterality: Left;  ? ? ?FAMILY HISTORY: family history includes Breast cancer in her paternal grandmother; Cancer in her brother and sister; Heart attack in her father. ? ?SOCIAL HISTORY:  reports that she has been smoking cigarettes. She has a 22.50 pack-year smoking history. She has been exposed to tobacco smoke. She has never used smokeless tobacco. She reports that she does not currently use alcohol. She reports that she does not use drugs. ? ?ALLERGIES: Penicillins, Ivp dye [iodinated contrast media], and Wound dressing adhesive ? ?MEDICATIONS:  ?Current Outpatient Medications  ?Medication Sig Dispense Refill  ? aspirin EC 81 MG tablet Take 81 mg by mouth at bedtime.    ? fluticasone (FLONASE) 50 MCG/ACT nasal spray Place 1 spray into both nostrils daily.    ? lisinopril (PRINIVIL,ZESTRIL) 10 MG tablet Take 10 mg by mouth every morning.     ? metoprolol tartrate (LOPRESSOR) 25 MG tablet Take 25 mg by mouth 2 (two) times daily.    ? rosuvastatin (CRESTOR) 5 MG tablet Rosuvastatin Calcium 5 MG Oral Tablet QTY: 90 tablet Days: 90 Refills: 3  Written: 05/16/21 Patient Instructions: TAKE 1 TABLET BY MOUTH EVERY DAY IN THE EVENING    ? oxybutynin (DITROPAN XL) 10 MG 24 hr tablet Take 1  tablet (10 mg total) by mouth daily as needed (bladder spasm/stent pain). (Patient not taking: Reported on 09/21/2021) 20 tablet 0  ? ?No current facility-administered medications for this encounter.  ? ? ?ECOG PERFORMANCE STATUS:  0 - Asymptomatic ? ?REVIEW OF SYSTEMS: Patient has COPD emphysema coronary artery disease hyperlipidemia hypertension ?Patient denies any weight loss, fatigue,  weakness, fever, chills or night sweats. Patient denies any loss of vision, blurred vision. Patient denies any ringing  of the ears or hearing loss. No irregular heartbeat. Patient denies heart murmur or history of fainting. Patient denies any chest pain or pain radiating to her upper extremities. Patient denies any shortness of breath, difficulty breathing at night, cough or hemoptysis. Patient denies any swelling in the lower legs. Patient denies any nausea vomiting, vomiting of blood, or coffee ground material in the vomitus. Patient denies any stomach pain. Patient states has had normal bowel movements no significant constipation or diarrhea. Patient denies any dysuria, hematuria or significant nocturia. Patient denies any problems walking, swelling in the joints or loss of balance. Patient denies any skin changes, loss of hair or loss of weight. Patient denies any excessive worrying or anxiety or significant depression. Patient denies any problems with insomnia. Patient denies excessive thirst, polyuria, polydipsia. Patient denies any swollen glands, patient denies easy bruising or easy bleeding. Patient denies any recent infections, allergies or URI. Patient "s visual fields have not changed significantly in recent time. ?  ?PHYSICAL EXAM: ?BP (!) 165/85 (BP Location: Left Arm, Patient Position: Sitting, Cuff Size: Small)   Pulse 63   Temp 98 ?F (36.7 ?C) (Tympanic)   Resp 16   Ht 5' 1.5" (1.562 m) Comment: Stated Ht.  Wt 82 lb 3.2 oz (37.3 kg)   BMI 15.28 kg/m?  ?Thin slightly cachectic female in NAD.  Well-developed well-nourished patient in NAD. HEENT reveals PERLA, EOMI, discs not visualized.  Oral cavity is clear. No oral mucosal lesions are identified. Neck is clear without evidence of cervical or supraclavicular adenopathy. Lungs are clear to A&P. Cardiac examination is essentially unremarkable with regular rate and rhythm without murmur rub or thrill. Abdomen is benign with no organomegaly or  masses noted. Motor sensory and DTR levels are equal and symmetric in the upper and lower extremities. Cranial nerves II through XII are grossly intact. Proprioception is intact. No peripheral adenopathy or edema is identified. No motor or sensory levels are noted. Crude visual fields are within normal range. ? ?LABORATORY DATA: Labs reviewed ? ?  ?RADIOLOGY RESULTS: CT scan reviewed PET CT scan has been ordered ? ? ?IMPRESSION: Stage I non-small cell lung cancer left upper lobe and nonoperable 71 year old female current smoker ? ?PLAN: At this time we will try to speed up the PET CT scan.  Based on those results I have discussed going ahead with SBRT.  I would plan on delivering 60 Gray in 5 fractions.  I do use motion restriction for dimensional treatment planning.  Low side effect profile of SBRT was discussed with the patient.  Possibility of development of a cough about a month out was discussed.  Patient comprehends my recommendations well.  We will set up CT simulation after PET/CT has been confirmed. ? ?I would like to take this opportunity to thank you for allowing me to participate in the care of your patient.. ? ?Noreene Filbert, MD ? ? ? ? ? ? ? ? ?

## 2021-09-22 ENCOUNTER — Ambulatory Visit (INDEPENDENT_AMBULATORY_CARE_PROVIDER_SITE_OTHER): Payer: Medicare PPO | Admitting: Urology

## 2021-09-22 DIAGNOSIS — Z466 Encounter for fitting and adjustment of urinary device: Secondary | ICD-10-CM

## 2021-09-22 DIAGNOSIS — N200 Calculus of kidney: Secondary | ICD-10-CM

## 2021-09-22 LAB — URINALYSIS, COMPLETE
Bilirubin, UA: NEGATIVE
Glucose, UA: NEGATIVE
Ketones, UA: NEGATIVE
Nitrite, UA: NEGATIVE
Specific Gravity, UA: 1.015 (ref 1.005–1.030)
Urobilinogen, Ur: 0.2 mg/dL (ref 0.2–1.0)
pH, UA: 7 (ref 5.0–7.5)

## 2021-09-22 LAB — MICROSCOPIC EXAMINATION: WBC, UA: >30 /hpf — AB (ref 0–5)

## 2021-09-22 MED ORDER — SULFAMETHOXAZOLE-TRIMETHOPRIM 800-160 MG PO TABS
1.0000 | ORAL_TABLET | Freq: Once | ORAL | Status: AC
  Administered 2021-09-22: 1 via ORAL

## 2021-09-22 NOTE — Progress Notes (Signed)
Cystoscopy Procedure Note: ? ?Indication: Stent removal s/p 09/09/2021 left ureteroscopy and laser lithotripsy for 1.2 cm UPJ stone ? ?Bactrim given for prophylaxis ? ?After informed consent and discussion of the procedure and its risks, Julia Dominguez was positioned and prepped in the standard fashion. Cystoscopy was performed with a flexible cystoscope. The stent was grasped with flexible graspers and removed in its entirety. The patient tolerated the procedure well. ? ?Findings: ?Uncomplicated stent removal ? ?Assessment and Plan: ?RTC 6 months KUB ? ?Billey Co, MD ?09/22/2021 ? ?

## 2021-09-22 NOTE — Patient Instructions (Signed)
Dietary Guidelines to Help Prevent Kidney Stones Kidney stones are deposits of minerals and salts that form inside your kidneys. Your risk of developing kidney stones may be greater depending on your diet, your lifestyle, the medicines you take, and whether you have certain medical conditions. Most people can lower their chances of developing kidney stones by following the instructions below. Your dietitian may give you more specific instructions depending on your overall health and the type of kidney stones you tend to develop. What are tips for following this plan? Reading food labels  Choose foods with "no salt added" or "low-salt" labels. Limit your salt (sodium) intake to less than 1,500 mg a day. Choose foods with calcium for each meal and snack. Try to eat about 300 mg of calcium at each meal. Foods that contain 200-500 mg of calcium a serving include: 8 oz (237 mL) of milk, calcium-fortifiednon-dairy milk, and calcium-fortifiedfruit juice. Calcium-fortified means that calcium has been added to these drinks. 8 oz (237 mL) of kefir, yogurt, and soy yogurt. 4 oz (114 g) of tofu. 1 oz (28 g) of cheese. 1 cup (150 g) of dried figs. 1 cup (91 g) of cooked broccoli. One 3 oz (85 g) can of sardines or mackerel. Most people need 1,000-1,500 mg of calcium a day. Talk to your dietitian about how much calcium is recommended for you. Shopping Buy plenty of fresh fruits and vegetables. Most people do not need to avoid fruits and vegetables, even if these foods contain nutrients that may contribute to kidney stones. When shopping for convenience foods, choose: Whole pieces of fruit. Pre-made salads with dressing on the side. Low-fat fruit and yogurt smoothies. Avoid buying frozen meals or prepared deli foods. These can be high in sodium. Look for foods with live cultures, such as yogurt and kefir. Choose high-fiber grains, such as whole-wheat breads, oat bran, and wheat cereals. Cooking Do not add  salt to food when cooking. Place a salt shaker on the table and allow each person to add his or her own salt to taste. Use vegetable protein, such as beans, textured vegetable protein (TVP), or tofu, instead of meat in pasta, casseroles, and soups. Meal planning Eat less salt, if told by your dietitian. To do this: Avoid eating processed or pre-made food. Avoid eating fast food. Eat less animal protein, including cheese, meat, poultry, or fish, if told by your dietitian. To do this: Limit the number of times you have meat, poultry, fish, or cheese each week. Eat a diet free of meat at least 2 days a week. Eat only one serving each day of meat, poultry, fish, or seafood. When you prepare animal protein, cut pieces into small portion sizes. For most meat and fish, one serving is about the size of the palm of your hand. Eat at least five servings of fresh fruits and vegetables each day. To do this: Keep fruits and vegetables on hand for snacks. Eat one piece of fruit or a handful of berries with breakfast. Have a salad and fruit at lunch. Have two kinds of vegetables at dinner. Limit foods that are high in a substance called oxalate. These include: Spinach (cooked), rhubarb, beets, sweet potatoes, and Swiss chard. Peanuts. Potato chips, french fries, and baked potatoes with skin on. Nuts and nut products. Chocolate. If you regularly take a diuretic medicine, make sure to eat at least 1 or 2 servings of fruits or vegetables that are high in potassium each day. These include: Avocado. Banana. Orange, prune,   carrot, or tomato juice. Baked potato. Cabbage. Beans and split peas. Lifestyle  Drink enough fluid to keep your urine pale yellow. This is the most important thing you can do. Spread your fluid intake throughout the day. If you drink alcohol: Limit how much you use to: 0-1 drink a day for women who are not pregnant. 0-2 drinks a day for men. Be aware of how much alcohol is in your  drink. In the [place] one drink equals one 12 oz bottle of beer (355 mL), one 5 oz glass of wine (148 mL), or one 1 oz glass of hard liquor (44 mL). Lose weight if told by your health care provider. Work with your dietitian to find an eating plan and weight loss strategies that work best for you. General information Talk to your health care provider and dietitian about taking daily supplements. You may be told the following depending on your health and the cause of your kidney stones: Not to take supplements with vitamin C. To take a calcium supplement. To take a daily probiotic supplement. To take other supplements such as magnesium, fish oil, or vitamin B6. Take over-the-counter and prescription medicines only as told by your health care provider. These include supplements. What foods should I limit? Limit your intake of the following foods, or eat them as told by your dietitian. Vegetables Spinach. Rhubarb. Beets. Canned vegetables. Pickles. Olives. Baked potatoes with skin. Grains Wheat bran. Baked goods. Salted crackers. Cereals high in sugar. Meats and other proteins Nuts. Nut butters. Large portions of meat, poultry, or fish. Salted, precooked, or cured meats, such as sausages, meat loaves, and hot dogs. Dairy Cheese. Beverages Regular soft drinks. Regular vegetable juice. Seasonings and condiments Seasoning blends with salt. Salad dressings. Soy sauce. Ketchup. Barbecue sauce. Other foods Canned soups. Canned pasta sauce. Casseroles. Pizza. Lasagna. Frozen meals. Potato chips. French fries. The items listed above may not be a complete list of foods and beverages you should limit. Contact a dietitian for more information. What foods should I avoid? Talk to your dietitian about specific foods you should avoid based on the type of kidney stones you have and your overall health. Fruits Grapefruit. The item listed above may not be a complete list of foods and beverages you should  avoid. Contact a dietitian for more information. Summary Kidney stones are deposits of minerals and salts that form inside your kidneys. You can lower your risk of kidney stones by making changes to your diet. The most important thing you can do is drink enough fluid. Drink enough fluid to keep your urine pale yellow. Talk to your dietitian about how much calcium you should have each day, and eat less salt and animal protein as told by your dietitian. This information is not intended to replace advice given to you by your health care provider. Make sure you discuss any questions you have with your health care provider. Document Revised: 01/24/2021 Document Reviewed: 01/24/2021 Elsevier Patient Education  2023 Elsevier Inc.  

## 2021-09-26 ENCOUNTER — Ambulatory Visit
Admission: RE | Admit: 2021-09-26 | Discharge: 2021-09-26 | Disposition: A | Discharge status: Home / Self Care | Payer: Medicare PPO | Source: Ambulatory Visit | Attending: Infectious Diseases | Admitting: Infectious Diseases

## 2021-09-26 DIAGNOSIS — R911 Solitary pulmonary nodule: Secondary | ICD-10-CM | POA: Diagnosis not present

## 2021-09-26 DIAGNOSIS — N132 Hydronephrosis with renal and ureteral calculous obstruction: Secondary | ICD-10-CM | POA: Insufficient documentation

## 2021-09-26 MED ORDER — FLUDEOXYGLUCOSE F - 18 (FDG) INJECTION
5.7600 | Freq: Once | INTRAVENOUS | Status: AC | PRN
  Administered 2021-09-26: 5.76 via INTRAVENOUS

## 2021-09-27 DIAGNOSIS — I10 Essential (primary) hypertension: Secondary | ICD-10-CM | POA: Diagnosis not present

## 2021-09-27 DIAGNOSIS — M810 Age-related osteoporosis without current pathological fracture: Secondary | ICD-10-CM | POA: Diagnosis not present

## 2021-09-27 LAB — GLUCOSE, CAPILLARY: Glucose-Capillary: 85 mg/dL (ref 70–99)

## 2021-09-29 ENCOUNTER — Ambulatory Visit
Admission: RE | Admit: 2021-09-29 | Discharge: 2021-09-29 | Disposition: A | Discharge status: Home / Self Care | Payer: Medicare PPO | Source: Ambulatory Visit | Attending: Radiation Oncology | Admitting: Radiation Oncology

## 2021-09-29 DIAGNOSIS — C3412 Malignant neoplasm of upper lobe, left bronchus or lung: Secondary | ICD-10-CM | POA: Insufficient documentation

## 2021-09-29 DIAGNOSIS — Z510 Encounter for antineoplastic radiation therapy: Secondary | ICD-10-CM | POA: Diagnosis not present

## 2021-09-29 DIAGNOSIS — F17210 Nicotine dependence, cigarettes, uncomplicated: Secondary | ICD-10-CM | POA: Insufficient documentation

## 2021-09-29 DIAGNOSIS — R911 Solitary pulmonary nodule: Secondary | ICD-10-CM | POA: Diagnosis not present

## 2021-09-30 ENCOUNTER — Encounter: Payer: Self-pay | Admitting: *Deleted

## 2021-10-02 DIAGNOSIS — C3412 Malignant neoplasm of upper lobe, left bronchus or lung: Secondary | ICD-10-CM | POA: Diagnosis not present

## 2021-10-02 DIAGNOSIS — R911 Solitary pulmonary nodule: Secondary | ICD-10-CM | POA: Diagnosis not present

## 2021-10-02 DIAGNOSIS — F17210 Nicotine dependence, cigarettes, uncomplicated: Secondary | ICD-10-CM | POA: Diagnosis not present

## 2021-10-02 DIAGNOSIS — Z510 Encounter for antineoplastic radiation therapy: Secondary | ICD-10-CM | POA: Diagnosis not present

## 2021-10-12 ENCOUNTER — Other Ambulatory Visit: Payer: Self-pay

## 2021-10-12 ENCOUNTER — Ambulatory Visit
Admission: RE | Admit: 2021-10-12 | Discharge: 2021-10-12 | Disposition: A | Discharge status: Home / Self Care | Payer: Medicare PPO | Source: Ambulatory Visit | Attending: Radiation Oncology | Admitting: Radiation Oncology

## 2021-10-12 DIAGNOSIS — F17210 Nicotine dependence, cigarettes, uncomplicated: Secondary | ICD-10-CM | POA: Diagnosis not present

## 2021-10-12 DIAGNOSIS — Z510 Encounter for antineoplastic radiation therapy: Secondary | ICD-10-CM | POA: Diagnosis not present

## 2021-10-12 DIAGNOSIS — C3412 Malignant neoplasm of upper lobe, left bronchus or lung: Secondary | ICD-10-CM | POA: Diagnosis not present

## 2021-10-12 LAB — RAD ONC ARIA SESSION SUMMARY
Course Elapsed Days: 0
Plan Fractions Treated to Date: 1
Plan Prescribed Dose Per Fraction: 12 Gy
Plan Total Fractions Prescribed: 5
Plan Total Prescribed Dose: 60 Gy
Reference Point Dosage Given to Date: 12 Gy
Reference Point Session Dosage Given: 12 Gy
Session Number: 1

## 2021-10-13 ENCOUNTER — Encounter: Payer: Self-pay | Admitting: *Deleted

## 2021-10-14 ENCOUNTER — Ambulatory Visit
Admission: RE | Admit: 2021-10-14 | Discharge: 2021-10-14 | Disposition: A | Discharge status: Home / Self Care | Payer: Medicare PPO | Source: Ambulatory Visit | Attending: Radiation Oncology | Admitting: Radiation Oncology

## 2021-10-14 ENCOUNTER — Other Ambulatory Visit: Payer: Self-pay

## 2021-10-14 DIAGNOSIS — C3412 Malignant neoplasm of upper lobe, left bronchus or lung: Secondary | ICD-10-CM | POA: Diagnosis not present

## 2021-10-14 DIAGNOSIS — F17210 Nicotine dependence, cigarettes, uncomplicated: Secondary | ICD-10-CM | POA: Diagnosis not present

## 2021-10-14 DIAGNOSIS — Z510 Encounter for antineoplastic radiation therapy: Secondary | ICD-10-CM | POA: Diagnosis not present

## 2021-10-14 LAB — RAD ONC ARIA SESSION SUMMARY
Course Elapsed Days: 2
Plan Fractions Treated to Date: 2
Plan Prescribed Dose Per Fraction: 12 Gy
Plan Total Fractions Prescribed: 5
Plan Total Prescribed Dose: 60 Gy
Reference Point Dosage Given to Date: 24 Gy
Reference Point Session Dosage Given: 12 Gy
Session Number: 2

## 2021-10-18 ENCOUNTER — Ambulatory Visit
Admission: RE | Admit: 2021-10-18 | Discharge: 2021-10-18 | Disposition: A | Discharge status: Home / Self Care | Payer: Medicare PPO | Source: Ambulatory Visit | Attending: Radiation Oncology | Admitting: Radiation Oncology

## 2021-10-18 ENCOUNTER — Other Ambulatory Visit: Payer: Self-pay

## 2021-10-18 DIAGNOSIS — Z510 Encounter for antineoplastic radiation therapy: Secondary | ICD-10-CM | POA: Diagnosis not present

## 2021-10-18 DIAGNOSIS — C3412 Malignant neoplasm of upper lobe, left bronchus or lung: Secondary | ICD-10-CM | POA: Diagnosis not present

## 2021-10-18 DIAGNOSIS — F17210 Nicotine dependence, cigarettes, uncomplicated: Secondary | ICD-10-CM | POA: Diagnosis not present

## 2021-10-18 LAB — RAD ONC ARIA SESSION SUMMARY
Course Elapsed Days: 6
Plan Fractions Treated to Date: 3
Plan Prescribed Dose Per Fraction: 12 Gy
Plan Total Fractions Prescribed: 5
Plan Total Prescribed Dose: 60 Gy
Reference Point Dosage Given to Date: 36 Gy
Reference Point Session Dosage Given: 12 Gy
Session Number: 3

## 2021-10-20 ENCOUNTER — Other Ambulatory Visit: Payer: Self-pay | Admitting: Infectious Diseases

## 2021-10-20 ENCOUNTER — Other Ambulatory Visit: Payer: Self-pay | Admitting: Internal Medicine

## 2021-10-20 ENCOUNTER — Ambulatory Visit: Payer: Medicare PPO

## 2021-10-20 DIAGNOSIS — Z1231 Encounter for screening mammogram for malignant neoplasm of breast: Secondary | ICD-10-CM

## 2021-10-21 ENCOUNTER — Ambulatory Visit
Admission: RE | Admit: 2021-10-21 | Discharge: 2021-10-21 | Disposition: A | Discharge status: Home / Self Care | Payer: Medicare PPO | Source: Ambulatory Visit | Attending: Radiation Oncology | Admitting: Radiation Oncology

## 2021-10-21 ENCOUNTER — Other Ambulatory Visit: Payer: Self-pay

## 2021-10-21 DIAGNOSIS — C3412 Malignant neoplasm of upper lobe, left bronchus or lung: Secondary | ICD-10-CM | POA: Diagnosis not present

## 2021-10-21 DIAGNOSIS — F17210 Nicotine dependence, cigarettes, uncomplicated: Secondary | ICD-10-CM | POA: Diagnosis not present

## 2021-10-21 DIAGNOSIS — Z510 Encounter for antineoplastic radiation therapy: Secondary | ICD-10-CM | POA: Diagnosis not present

## 2021-10-21 LAB — RAD ONC ARIA SESSION SUMMARY
Course Elapsed Days: 9
Plan Fractions Treated to Date: 4
Plan Prescribed Dose Per Fraction: 12 Gy
Plan Total Fractions Prescribed: 5
Plan Total Prescribed Dose: 60 Gy
Reference Point Dosage Given to Date: 48 Gy
Reference Point Session Dosage Given: 12 Gy
Session Number: 4

## 2021-10-25 ENCOUNTER — Ambulatory Visit
Admission: RE | Admit: 2021-10-25 | Discharge: 2021-10-25 | Disposition: A | Discharge status: Home / Self Care | Payer: Medicare PPO | Source: Ambulatory Visit | Attending: Radiation Oncology | Admitting: Radiation Oncology

## 2021-10-25 ENCOUNTER — Other Ambulatory Visit: Payer: Self-pay

## 2021-10-25 DIAGNOSIS — Z510 Encounter for antineoplastic radiation therapy: Secondary | ICD-10-CM | POA: Diagnosis not present

## 2021-10-25 DIAGNOSIS — C3412 Malignant neoplasm of upper lobe, left bronchus or lung: Secondary | ICD-10-CM | POA: Diagnosis not present

## 2021-10-25 DIAGNOSIS — F17210 Nicotine dependence, cigarettes, uncomplicated: Secondary | ICD-10-CM | POA: Diagnosis not present

## 2021-10-25 DIAGNOSIS — R911 Solitary pulmonary nodule: Secondary | ICD-10-CM | POA: Diagnosis not present

## 2021-10-25 LAB — RAD ONC ARIA SESSION SUMMARY
Course Elapsed Days: 13
Plan Fractions Treated to Date: 5
Plan Prescribed Dose Per Fraction: 12 Gy
Plan Total Fractions Prescribed: 5
Plan Total Prescribed Dose: 60 Gy
Reference Point Dosage Given to Date: 60 Gy
Reference Point Session Dosage Given: 12 Gy
Session Number: 5

## 2021-10-26 DIAGNOSIS — E785 Hyperlipidemia, unspecified: Secondary | ICD-10-CM | POA: Diagnosis not present

## 2021-10-26 DIAGNOSIS — R911 Solitary pulmonary nodule: Secondary | ICD-10-CM | POA: Diagnosis not present

## 2021-10-26 DIAGNOSIS — Z87442 Personal history of urinary calculi: Secondary | ICD-10-CM | POA: Diagnosis not present

## 2021-10-26 DIAGNOSIS — I2510 Atherosclerotic heart disease of native coronary artery without angina pectoris: Secondary | ICD-10-CM | POA: Diagnosis not present

## 2021-10-26 DIAGNOSIS — R634 Abnormal weight loss: Secondary | ICD-10-CM | POA: Diagnosis not present

## 2021-10-26 DIAGNOSIS — Z8679 Personal history of other diseases of the circulatory system: Secondary | ICD-10-CM | POA: Diagnosis not present

## 2021-10-26 DIAGNOSIS — J449 Chronic obstructive pulmonary disease, unspecified: Secondary | ICD-10-CM | POA: Diagnosis not present

## 2021-10-26 DIAGNOSIS — F17210 Nicotine dependence, cigarettes, uncomplicated: Secondary | ICD-10-CM | POA: Diagnosis not present

## 2021-10-26 DIAGNOSIS — I10 Essential (primary) hypertension: Secondary | ICD-10-CM | POA: Diagnosis not present

## 2021-11-23 ENCOUNTER — Ambulatory Visit
Admission: RE | Admit: 2021-11-23 | Discharge: 2021-11-23 | Disposition: A | Discharge status: Home / Self Care | Payer: Medicare PPO | Source: Ambulatory Visit | Attending: Radiation Oncology | Admitting: Radiation Oncology

## 2021-11-23 ENCOUNTER — Encounter: Payer: Self-pay | Admitting: Radiation Oncology

## 2021-11-23 ENCOUNTER — Other Ambulatory Visit: Payer: Self-pay | Admitting: *Deleted

## 2021-11-23 VITALS — BP 149/88 | HR 83 | Temp 97.4°F | Resp 12 | Ht 61.5 in | Wt 86.3 lb

## 2021-11-23 DIAGNOSIS — Z923 Personal history of irradiation: Secondary | ICD-10-CM | POA: Insufficient documentation

## 2021-11-23 DIAGNOSIS — R911 Solitary pulmonary nodule: Secondary | ICD-10-CM | POA: Insufficient documentation

## 2021-11-23 NOTE — Progress Notes (Signed)
Radiation Oncology Follow up Note  Name: Julia Dominguez [facility]   Date:   11/23/2021 MRN:  [id number] DOB: [birth year]    This 71 y.o. female presents to the clinic today for 1 month follow-up status post SBRT for presumed stage I non-small cell lung cancer of left upper lobe.  REFERRING PROVIDER: Ebbie Ridge, MD  HPI: Patient is a 71 year old female now at 1 month having completed SBRT for presumed stage I non-small cell lung cancer of the left upper lobe.  Seen today in routine follow-up she is doing well.  She specifically denies cough hemoptysis or any change in her pulmonary status.  She is having no dysphagia..  COMPLICATIONS OF TREATMENT: none  FOLLOW UP COMPLIANCE: keeps appointments   PHYSICAL EXAM:  BP (!) 149/88 (BP Location: Left Arm, Patient Position: Sitting, Cuff Size: Small)   Pulse 83   Temp (!) 97.4 F (36.3 C)   Resp 12   Ht 5' 1.5" (1.562 m) Comment: stated Ht  Wt 86 lb 4.8 oz (39.1 kg)   BMI 16.04 kg/m  Well-developed well-nourished patient in NAD. HEENT reveals PERLA, EOMI, discs not visualized.  Oral cavity is clear. No oral mucosal lesions are identified. Neck is clear without evidence of cervical or supraclavicular adenopathy. Lungs are clear to A&P. Cardiac examination is essentially unremarkable with regular rate and rhythm without murmur rub or thrill. Abdomen is benign with no organomegaly or masses noted. Motor sensory and DTR levels are equal and symmetric in the upper and lower extremities. Cranial nerves II through XII are grossly intact. Proprioception is intact. No peripheral adenopathy or edema is identified. No motor or sensory levels are noted. Crude visual fields are within normal range.  RADIOLOGY RESULTS: CT scan of the chest ordered  PLAN: Present time patient is doing well with very low side effect profile from her recent SBRT.  I am pleased with her overall progress.  Of asked to see her back in 3 months with a CT scan of her chest  prior to that visit.  We will do a noncontrast since she is allergic to dye.  Patient comprehends my recommendations well.  I would like to take this opportunity to thank you for allowing me to participate in the care of your patient.Noreene Filbert, MD

## 2021-11-25 ENCOUNTER — Other Ambulatory Visit (HOSPITAL_COMMUNITY): Payer: Medicare PPO

## 2021-12-05 ENCOUNTER — Ambulatory Visit
Admission: RE | Admit: 2021-12-05 | Discharge: 2021-12-05 | Disposition: A | Discharge status: Home / Self Care | Payer: Medicare PPO | Source: Ambulatory Visit | Attending: Infectious Diseases | Admitting: Infectious Diseases

## 2021-12-05 DIAGNOSIS — Z1231 Encounter for screening mammogram for malignant neoplasm of breast: Secondary | ICD-10-CM

## 2022-02-08 ENCOUNTER — Inpatient Hospital Stay: Payer: Medicare PPO | Attending: Radiation Oncology

## 2022-02-08 DIAGNOSIS — C3412 Malignant neoplasm of upper lobe, left bronchus or lung: Secondary | ICD-10-CM | POA: Insufficient documentation

## 2022-02-08 DIAGNOSIS — R911 Solitary pulmonary nodule: Secondary | ICD-10-CM

## 2022-02-08 LAB — COMPREHENSIVE METABOLIC PANEL
ALT: 12 U/L (ref 0–44)
AST: 21 U/L (ref 15–41)
Albumin: 4.1 g/dL (ref 3.5–5.0)
Alkaline Phosphatase: 65 U/L (ref 38–126)
Anion gap: 4 — ABNORMAL LOW (ref 5–15)
BUN: 18 mg/dL (ref 8–23)
CO2: 30 mmol/L (ref 22–32)
Calcium: 9.8 mg/dL (ref 8.9–10.3)
Chloride: 102 mmol/L (ref 98–111)
Creatinine, Ser: 0.83 mg/dL (ref 0.44–1.00)
GFR, Estimated: >60 mL/min (ref >60)
Glucose, Bld: 94 mg/dL (ref 70–99)
Potassium: 5.2 mmol/L — ABNORMAL HIGH (ref 3.5–5.1)
Sodium: 136 mmol/L (ref 135–145)
Total Bilirubin: 0.4 mg/dL (ref 0.3–1.2)
Total Protein: 7.2 g/dL (ref 6.5–8.1)

## 2022-02-15 ENCOUNTER — Ambulatory Visit
Admission: RE | Admit: 2022-02-15 | Discharge: 2022-02-15 | Disposition: A | Discharge status: Home / Self Care | Payer: Medicare PPO | Source: Ambulatory Visit | Attending: Radiation Oncology | Admitting: Radiation Oncology

## 2022-02-15 DIAGNOSIS — R911 Solitary pulmonary nodule: Secondary | ICD-10-CM | POA: Insufficient documentation

## 2022-02-22 ENCOUNTER — Ambulatory Visit
Admission: RE | Admit: 2022-02-22 | Discharge: 2022-02-22 | Disposition: A | Discharge status: Home / Self Care | Payer: Medicare PPO | Source: Ambulatory Visit | Attending: Radiation Oncology | Admitting: Radiation Oncology

## 2022-02-22 ENCOUNTER — Encounter: Payer: Self-pay | Admitting: Radiation Oncology

## 2022-02-22 VITALS — BP 166/75 | HR 58 | Temp 97.2°F | Resp 16 | Ht 61.5 in | Wt 95.1 lb

## 2022-02-22 DIAGNOSIS — R911 Solitary pulmonary nodule: Secondary | ICD-10-CM | POA: Diagnosis present

## 2022-02-22 DIAGNOSIS — Z923 Personal history of irradiation: Secondary | ICD-10-CM | POA: Insufficient documentation

## 2022-02-22 NOTE — Progress Notes (Signed)
Radiation Oncology Follow up Note  Name: Julia Dominguez [facility]   Date:   02/22/2022 MRN:  [id number] DOB: [birth year]    This 70 y.o. female presents to the clinic today for 54-month follow-up status post SBRT for presumed stage I non-small cell lung cancer left upper lobe.  REFERRING PROVIDER: Ebbie Ridge, MD  HPI: Patient is a 71 year old female now out for months having completed SBRT to her left upper lobe for stage I non-small cell lung cancer seen today in routine follow-up she is doing well she is asymptomatic.  She specifically denies cough hemoptysis or any change in her pulmonary status.  She had a CT scan of her chest showing slight increase in the area of nodularity consistent with radiation change.  No other progressive disease is noted in her chest..  COMPLICATIONS OF TREATMENT: none  FOLLOW UP COMPLIANCE: keeps appointments   PHYSICAL EXAM:  BP (!) 166/75 (BP Location: Left Arm, Patient Position: Sitting, Cuff Size: Small)   Pulse (!) 58   Temp (!) 97.2 F (36.2 C) (Tympanic)   Resp 16   Ht 5' 1.5" (1.562 m)   Wt 95 lb 1.6 oz (43.1 kg)   BMI 17.68 kg/m  Well-developed well-nourished patient in NAD. HEENT reveals PERLA, EOMI, discs not visualized.  Oral cavity is clear. No oral mucosal lesions are identified. Neck is clear without evidence of cervical or supraclavicular adenopathy. Lungs are clear to A&P. Cardiac examination is essentially unremarkable with regular rate and rhythm without murmur rub or thrill. Abdomen is benign with no organomegaly or masses noted. Motor sensory and DTR levels are equal and symmetric in the upper and lower extremities. Cranial nerves II through XII are grossly intact. Proprioception is intact. No peripheral adenopathy or edema is identified. No motor or sensory levels are noted. Crude visual fields are within normal range.  RADIOLOGY RESULTS: CT scan reviewed compatible with above-stated findings  PLAN: Present time patient is  doing well with extremely low side effect profile and evidence of treatment response by CT criteria.  I am pleased with her overall progress.  Of asked to see her back in 6 months for follow-up with a repeat CT scan of her chest.  Patient knows to call at anytime with any concerns.  I would like to take this opportunity to thank you for allowing me to participate in the care of your patient.Noreene Filbert, MD

## 2022-03-30 ENCOUNTER — Ambulatory Visit
Admission: RE | Admit: 2022-03-30 | Discharge: 2022-03-30 | Disposition: A | Discharge status: Home / Self Care | Payer: Medicare PPO | Attending: Urology | Admitting: Urology

## 2022-03-30 ENCOUNTER — Ambulatory Visit
Admission: RE | Admit: 2022-03-30 | Discharge: 2022-03-30 | Disposition: A | Discharge status: Home / Self Care | Payer: Medicare PPO | Source: Ambulatory Visit | Attending: Urology | Admitting: Urology

## 2022-03-30 ENCOUNTER — Ambulatory Visit: Payer: Medicare PPO | Admitting: Urology

## 2022-03-30 VITALS — BP 177/92 | HR 73 | Ht 61.5 in | Wt 95.0 lb

## 2022-03-30 DIAGNOSIS — N200 Calculus of kidney: Secondary | ICD-10-CM | POA: Insufficient documentation

## 2022-03-30 DIAGNOSIS — Z87442 Personal history of urinary calculi: Secondary | ICD-10-CM

## 2022-03-30 NOTE — Progress Notes (Signed)
   03/30/2022 2:48 PM   [place] 1950/08/18 [id number]  Reason for visit: Follow up nephrolithiasis  HPI: 71 year old female who presented with a large 1.2 cm left UPJ stone in April 2023 and underwent uncomplicated left ureteroscopy, laser lithotripsy, and stent placement.  Stent was removed 2 weeks later, and she has been doing well since that time.  She denies any urinary problems or flank pain.  I personally viewed and interpreted her KUB today that shows no residual evidence of stone disease.  She recently underwent radiation treatment for a 1 cm suspected lung cancer and denies any complications from that thus far.  We discussed general stone prevention strategies including adequate hydration with goal of producing 2.5 L of urine daily, increasing citric acid intake, increasing calcium intake during high oxalate meals, minimizing animal protein, and decreasing salt intake. Information about dietary recommendations given today.   RTC 1 year KUB prior, if no evidence of stones can consider follow-up as needed  Billey Co, [place] [address], [place], [place] [address] [phone]

## 2022-03-30 NOTE — Patient Instructions (Signed)
Kidney stone prevention  The best way to prevent kidney stones is drink plenty of fluids to stay hydrated.  You can add lemon juice or Crystal light lemonade to the water to help prevent kidney stones.  Follow a low-salt diet, and avoid spinach.  A normal amount of calcium intake is okay.

## 2022-08-30 ENCOUNTER — Ambulatory Visit
Admission: RE | Admit: 2022-08-30 | Discharge: 2022-08-30 | Disposition: A | Discharge status: Home / Self Care | Payer: Medicare PPO | Source: Ambulatory Visit | Attending: Radiation Oncology | Admitting: Radiation Oncology

## 2022-08-30 DIAGNOSIS — R911 Solitary pulmonary nodule: Secondary | ICD-10-CM | POA: Insufficient documentation

## 2022-09-06 ENCOUNTER — Ambulatory Visit
Admission: RE | Admit: 2022-09-06 | Discharge: 2022-09-06 | Disposition: A | Discharge status: Home / Self Care | Payer: Medicare PPO | Source: Ambulatory Visit | Attending: Radiation Oncology | Admitting: Radiation Oncology

## 2022-09-06 ENCOUNTER — Other Ambulatory Visit: Payer: Self-pay | Admitting: *Deleted

## 2022-09-06 ENCOUNTER — Encounter: Payer: Self-pay | Admitting: Radiation Oncology

## 2022-09-06 VITALS — BP 164/77 | HR 59 | Temp 98.5°F | Resp 16 | Ht 61.5 in | Wt 94.0 lb

## 2022-09-06 DIAGNOSIS — R911 Solitary pulmonary nodule: Secondary | ICD-10-CM | POA: Insufficient documentation

## 2022-09-06 DIAGNOSIS — Z923 Personal history of irradiation: Secondary | ICD-10-CM | POA: Insufficient documentation

## 2022-09-06 NOTE — Progress Notes (Signed)
Radiation Oncology Follow up Note  Name: Alazne Pentland [facility]   Date:   09/06/2022 MRN:  [id number] DOB: 16-May-1951    This 72 y.o. female presents to the clinic today for 32-month follow-up status post SBRT for presumed stage I non-small cell lung cancer of the left upper lobe.  REFERRING PROVIDER: Gayla Doss, MD  HPI: Patient is a 72 year old female now out 10 months having completed SBRT to her left upper lobe for stage I non-small cell lung cancer seen today in routine follow-up she is doing well.  She specifically [place] cough any change in pulmonary status fatigue or dysphagia..  She had a recent CT scan which I have reviewed showing solid pulmonary nodule in the left upper lobe slightly decreased in size with findings consistent with radiation change.  No evidence of progressive disease or metastatic disease is seen.  COMPLICATIONS OF TREATMENT: none  FOLLOW UP COMPLIANCE: keeps appointments   PHYSICAL EXAM:  BP (!) 164/77 Comment: BP recheck still elevated patient will follow up with PCP  Pulse (!) 59   Temp 98.5 F (36.9 C) (Tympanic)   Resp 16   Ht 5' 1.5" (1.562 m)   Wt 94 lb (42.6 kg)   BMI 17.47 kg/m  Well-developed well-nourished patient in NAD. HEENT reveals PERLA, EOMI, discs not visualized.  Oral cavity is clear. No oral mucosal lesions are identified. Neck is clear without evidence of cervical or supraclavicular adenopathy. Lungs are clear to A&P. Cardiac examination is essentially unremarkable with regular rate and rhythm without murmur rub or thrill. Abdomen is benign with no organomegaly or masses noted. Motor sensory and DTR levels are equal and symmetric in the upper and lower extremities. Cranial nerves II through XII are grossly intact. Proprioception is intact. No peripheral adenopathy or edema is identified. No motor or sensory levels are noted. Crude visual fields are within normal range.  RADIOLOGY RESULTS: CT scans reviewed compatible with  above-stated findings  PLAN: Present time patient is doing well no progression of disease by recent CT scan.  I asked to see her back in 6 months for follow-up with repeat CT scan at that time.  Should that be stable we will go out 1 year for her follow-up visits.  Patient is to call in the meantime with any concerns.  I would like to take this opportunity to thank you for allowing me to participate in the care of your patient.[name], MD

## 2022-10-24 ENCOUNTER — Other Ambulatory Visit: Payer: Self-pay | Admitting: Infectious Diseases

## 2022-10-24 DIAGNOSIS — Z1231 Encounter for screening mammogram for malignant neoplasm of breast: Secondary | ICD-10-CM

## 2022-11-16 ENCOUNTER — Other Ambulatory Visit: Payer: Self-pay | Admitting: Internal Medicine

## 2022-11-24 ENCOUNTER — Other Ambulatory Visit: Payer: Self-pay | Admitting: Internal Medicine

## 2022-11-28 ENCOUNTER — Other Ambulatory Visit: Payer: Self-pay | Admitting: Internal Medicine

## 2022-12-07 ENCOUNTER — Ambulatory Visit
Admission: RE | Admit: 2022-12-07 | Discharge: 2022-12-07 | Disposition: A | Discharge status: Home / Self Care | Payer: Medicare PPO | Source: Ambulatory Visit | Attending: Infectious Diseases | Admitting: Infectious Diseases

## 2022-12-07 DIAGNOSIS — Z1231 Encounter for screening mammogram for malignant neoplasm of breast: Secondary | ICD-10-CM | POA: Insufficient documentation

## 2023-03-08 ENCOUNTER — Ambulatory Visit
Admission: RE | Admit: 2023-03-08 | Discharge: 2023-03-08 | Disposition: A | Discharge status: Home / Self Care | Payer: Medicare PPO | Source: Ambulatory Visit | Attending: Radiation Oncology | Admitting: Radiation Oncology

## 2023-03-08 DIAGNOSIS — R911 Solitary pulmonary nodule: Secondary | ICD-10-CM | POA: Insufficient documentation

## 2023-03-15 ENCOUNTER — Encounter: Payer: Self-pay | Admitting: Radiation Oncology

## 2023-03-15 ENCOUNTER — Ambulatory Visit
Admission: RE | Admit: 2023-03-15 | Discharge: 2023-03-15 | Disposition: A | Discharge status: Home / Self Care | Payer: Medicare PPO | Source: Ambulatory Visit | Attending: Radiation Oncology | Admitting: Radiation Oncology

## 2023-03-15 ENCOUNTER — Other Ambulatory Visit: Payer: Self-pay | Admitting: *Deleted

## 2023-03-15 VITALS — BP 174/84 | HR 61 | Temp 97.6°F | Resp 16 | Wt 95.5 lb

## 2023-03-15 DIAGNOSIS — R911 Solitary pulmonary nodule: Secondary | ICD-10-CM | POA: Insufficient documentation

## 2023-03-15 DIAGNOSIS — Z923 Personal history of irradiation: Secondary | ICD-10-CM | POA: Diagnosis not present

## 2023-03-15 NOTE — Progress Notes (Signed)
Radiation Oncology Follow up Note  Name: Julia Dominguez [facility]   Date:   03/15/2023 MRN:  [id number] DOB: 09/18/1950    This 72 y.o. female presents to the clinic today for 74-month follow-up status post SBRT for presumed stage I non-small cell lung cancer of the left upper lobe.  REFERRING PROVIDER: Gayla Doss, MD  HPI: Patient is a 72 year old female now out 16 months having pleated SBRT to her left upper lobe for presumed stage I non-small cell lung cancer seen today in routine follow-up she is doing well.  She specifically denies cough hemoptysis chest tightness or any change in her pulmonary status.  She had a recent CT scan which has not been formally read.  My interpretation is stable minimal scarring in the left upper lobe consistent with treatment response..  COMPLICATIONS OF TREATMENT: none  FOLLOW UP COMPLIANCE: keeps appointments   PHYSICAL EXAM:  BP (!) 174/84 Comment: patient will follow with PCP regarding elevated BP.  Pulse 61   Temp 97.6 F (36.4 C) (Tympanic)   Resp 16   Wt 95 lb 8 oz (43.3 kg)   BMI 17.75 kg/m  Well-developed well-nourished patient in NAD. HEENT reveals PERLA, EOMI, discs not visualized.  Oral cavity is clear. No oral mucosal lesions are identified. Neck is clear without evidence of cervical or supraclavicular adenopathy. Lungs are clear to A&P. Cardiac examination is essentially unremarkable with regular rate and rhythm without murmur rub or thrill. Abdomen is benign with no organomegaly or masses noted. Motor sensory and DTR levels are equal and symmetric in the upper and lower extremities. Cranial nerves II through XII are grossly intact. Proprioception is intact. No peripheral adenopathy or edema is identified. No motor or sensory levels are noted. Crude visual fields are within normal range.  RADIOLOGY RESULTS: CT scan reviewed compatible with above-stated findings  PLAN: Present time patient is doing well chest CT is stable by my  interpretation of asked to see her back 1 more time at 6 months with a CT scan of her chest.  Will then go to once year follow-up visits.  Patient comprehends my recommendations well.  I would like to take this opportunity to thank you for allowing me to participate in the care of your patient.Julia Miller, MD

## 2023-03-29 ENCOUNTER — Ambulatory Visit
Admission: RE | Admit: 2023-03-29 | Discharge: 2023-03-29 | Disposition: A | Discharge status: Home / Self Care | Payer: Medicare PPO | Source: Ambulatory Visit | Attending: Urology | Admitting: Urology

## 2023-03-29 ENCOUNTER — Other Ambulatory Visit: Payer: Self-pay | Admitting: Urology

## 2023-03-29 ENCOUNTER — Ambulatory Visit: Payer: Medicare PPO | Admitting: Urology

## 2023-03-29 VITALS — BP 205/106 | HR 83 | Ht 61.5 in | Wt 89.5 lb

## 2023-03-29 DIAGNOSIS — N200 Calculus of kidney: Secondary | ICD-10-CM

## 2023-03-29 DIAGNOSIS — Z09 Encounter for follow-up examination after completed treatment for conditions other than malignant neoplasm: Secondary | ICD-10-CM

## 2023-03-29 DIAGNOSIS — Z87440 Personal history of urinary (tract) infections: Secondary | ICD-10-CM | POA: Diagnosis not present

## 2023-03-29 NOTE — Progress Notes (Signed)
03/29/2023 2:37 PM   Julia Dominguez [birth year] [id number]  Reason for visit: Follow up nephrolithiasis  HPI: 72 year old female who presented with a large 1.2 cm left UPJ stone in April 2023 and underwent uncomplicated left ureteroscopy, laser lithotripsy, and stent placement.  She has been doing well since that time.  She has had no kidney stones since then, denies any flank pain or gross hematuria.  She was also treated with radiation for lung cancer and is followed by Dr. Aggie Cosier.  I personally viewed and interpreted her KUB today that shows no evidence of recurrent stone disease.  We discussed general stone prevention strategies including adequate hydration with goal of producing 2.5 L of urine daily, increasing citric acid intake, increasing calcium intake during high oxalate meals, minimizing animal protein, and decreasing salt intake. Information about dietary recommendations given today.   She prefers follow-up as needed, return precautions were discussed    [name], MD  [facility] [address], [address] [place], [place] [address] [phone]

## 2023-07-27 LAB — EXTERNAL GENERIC LAB PROCEDURE

## 2023-08-07 LAB — EXTERNAL GENERIC LAB PROCEDURE: COLOGUARD: NEGATIVE

## 2023-08-07 LAB — COLOGUARD: COLOGUARD: NEGATIVE

## 2023-09-06 ENCOUNTER — Ambulatory Visit
Admission: RE | Admit: 2023-09-06 | Discharge: 2023-09-06 | Disposition: A | Discharge status: Home / Self Care | Payer: Medicare PPO | Source: Ambulatory Visit | Attending: Radiation Oncology | Admitting: Radiation Oncology

## 2023-09-06 DIAGNOSIS — R911 Solitary pulmonary nodule: Secondary | ICD-10-CM | POA: Diagnosis present

## 2023-09-22 IMAGING — PT NM PET TUM IMG INITIAL (PI) SKULL BASE T - THIGH
1 of 7 series · 1 of 25 positions shown · non-contrast
Comparison: CT on 08/29/2021

CLINICAL DATA: Initial treatment strategy for left lung nodule.

EXAM:
NUCLEAR MEDICINE PET SKULL BASE TO THIGH
TECHNIQUE: 5.8 mCi F-18 FDG was injected intravenously. Full-ring PET imaging
was performed from the skull base to thigh after the radiotracer. CT
data was obtained and used for attenuation correction and anatomic
localization.
Fasting blood glucose: 85 mg/dl

[Series 303: pet axial · axial · 3.3mm · 5.47mm/px · 1 of 263 slices shown]
[im 158/263]
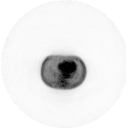

[1 of 25 positions shown; findings below may reference images not displayed]

FINDINGS: Mediastinal blood-pool activity (background): SUV max =

Liver activity (reference): SUV max = N/A

NECK: Asymmetric hypermetabolic activity is seen in the right
nasopharynx, although no definite soft tissue asymmetry is seen in
this region. No hypermetabolic lymph nodes or other masses
identified within the neck.

Incidental CT findings:  None.

CHEST: No hypermetabolic lymph nodes. A 10 mm spiculated nodule is
seen in the posterior left upper lobe which shows low-grade FDG
uptake, with SUV max of 1.1. No other suspicious pulmonary nodules
or masses identified. Moderate centrilobular emphysema noted. No
evidence of pleural effusion.

Incidental CT findings: Aberrant origin of right subclavian artery
again noted. Aortic and coronary atherosclerotic calcification
noted.

ABDOMEN/PELVIS: No abnormal hypermetabolic activity within the
liver, pancreas, adrenal glands, or spleen. No hypermetabolic lymph
nodes in the abdomen or pelvis.

Incidental CT findings: A few tiny 2-3 mm nonobstructing calculi
noted in the lower pole of the left kidney. Sigmoid diverticulosis,
without evidence of diverticulitis. Aortic atherosclerotic
calcification noted.

SKELETON: No focal hypermetabolic bone lesions to suggest skeletal
metastasis.

Incidental CT findings:  None.
IMPRESSION: 10 mm spiculated nodule in posterior left upper lobe shows low-grade
FDG uptake, but is not hypermetabolic. Low-grade adenocarcinoma
cannot be excluded. Tissue sampling should be considered.

No evidence of thoracic lymph node or distant metastatic disease.

Asymmetric hypermetabolic activity in the right nasopharynx, without
CT correlate. Recommend further evaluation with neck CT or MRI with
contrast.

## 2023-09-24 ENCOUNTER — Encounter: Payer: Self-pay | Admitting: Radiation Oncology

## 2023-09-24 ENCOUNTER — Ambulatory Visit
Admission: RE | Admit: 2023-09-24 | Discharge: 2023-09-24 | Disposition: A | Discharge status: Home / Self Care | Payer: Medicare PPO | Source: Ambulatory Visit | Attending: Radiation Oncology | Admitting: Radiation Oncology

## 2023-09-24 ENCOUNTER — Other Ambulatory Visit: Payer: Self-pay | Admitting: *Deleted

## 2023-09-24 VITALS — BP 149/82 | HR 66 | Temp 98.0°F | Resp 16 | Wt 88.0 lb

## 2023-09-24 DIAGNOSIS — R911 Solitary pulmonary nodule: Secondary | ICD-10-CM | POA: Diagnosis present

## 2023-09-24 DIAGNOSIS — Z923 Personal history of irradiation: Secondary | ICD-10-CM | POA: Insufficient documentation

## 2023-09-24 NOTE — Progress Notes (Signed)
 Radiation Oncology Follow up Note  Name: Koby Pierpoint [facility]   Date:   09/24/2023 MRN:  [id number] DOB: Jan 02, 1951    This 73 y.o. female presents to the clinic today for 95-month follow-up status post SBRT for presumed stage I non-small cell lung cancer left upper lobe.  REFERRING PROVIDER: Welton Hall, MD  HPI: Patient is a 73 year old female now out 22 months having completed SBRT to her left upper lobe for presumed stage I non-small small cell lung cancer.  Seen today in routine follow-up she is doing well specifically [place] cough hemoptysis any chest tightness or any change in her pulmonary status.  She had a recent CT scan which I have reviewed.  Which is stable showing no acute intrathoracic pathology.  She had a similar appearance of a 9 x 7 mm left upper lobe nodule with adjacent radiation changes and scarring.  COMPLICATIONS OF TREATMENT: none  FOLLOW UP COMPLIANCE: keeps appointments   PHYSICAL EXAM:  BP (!) 149/82   Pulse 66   Temp 98 F (36.7 C) (Tympanic)   Resp 16   Wt 88 lb (39.9 kg)   BMI 16.36 kg/m  Well-developed well-nourished patient in NAD. HEENT reveals PERLA, EOMI, discs not visualized.  Oral cavity is clear. No oral mucosal lesions are identified. Neck is clear without evidence of cervical or supraclavicular adenopathy. Lungs are clear to A&P. Cardiac examination is essentially unremarkable with regular rate and rhythm without murmur rub or thrill. Abdomen is benign with no organomegaly or masses noted. Motor sensory and DTR levels are equal and symmetric in the upper and lower extremities. Cranial nerves II through XII are grossly intact. Proprioception is intact. No peripheral adenopathy or edema is identified. No motor or sensory levels are noted. Crude visual fields are within normal range.  RADIOLOGY RESULTS: CT scans of the chest reviewed compatible with above-stated findings  PLAN: Present time patient is now close to 2 years with no evidence of  disease or progression in her chest.  I have scheduled her for a 1 year follow-up with repeat CT scan.  She is clinically stable with no significant symptoms.  And pleased with her overall progress.  Of asked to see her back in 1 year as described above.  I would like to take this opportunity to thank you for allowing me to participate in the care of your patient.Glenis Langdon, MD

## 2023-10-24 ENCOUNTER — Other Ambulatory Visit: Payer: Self-pay | Admitting: Infectious Diseases

## 2023-10-24 DIAGNOSIS — Z1231 Encounter for screening mammogram for malignant neoplasm of breast: Secondary | ICD-10-CM

## 2023-12-10 ENCOUNTER — Ambulatory Visit
Admission: RE | Admit: 2023-12-10 | Discharge: 2023-12-10 | Disposition: A | Discharge status: Home / Self Care | Source: Ambulatory Visit | Attending: Infectious Diseases | Admitting: Infectious Diseases

## 2023-12-10 DIAGNOSIS — Z1231 Encounter for screening mammogram for malignant neoplasm of breast: Secondary | ICD-10-CM | POA: Diagnosis present

## 2024-09-23 ENCOUNTER — Other Ambulatory Visit

## 2024-10-02 ENCOUNTER — Ambulatory Visit: Admitting: Radiation Oncology
